# Patient Record
Sex: Female | Born: 1990 | Race: Black or African American | Hispanic: No | Marital: Single | Smoking: Former smoker
Health system: Southern US, Community
[De-identification: ages and names within clinical notes are randomized; demographics above are authoritative.]

## PROBLEM LIST (undated history)

## (undated) DIAGNOSIS — N730 Acute parametritis and pelvic cellulitis: Secondary | ICD-10-CM

## (undated) DIAGNOSIS — O139 Gestational [pregnancy-induced] hypertension without significant proteinuria, unspecified trimester: Secondary | ICD-10-CM

## (undated) DIAGNOSIS — B999 Unspecified infectious disease: Secondary | ICD-10-CM

## (undated) HISTORY — PX: WISDOM TOOTH EXTRACTION: SHX21

## (undated) HISTORY — DX: Gestational (pregnancy-induced) hypertension without significant proteinuria, unspecified trimester: O13.9

## (undated) HISTORY — PX: THERAPEUTIC ABORTION: SHX798

---
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
## 2010-10-18 LAB — URINE CULTURE: Colony Count: 30000

## 2010-10-18 LAB — POCT URINALYSIS DIP (DEVICE)
Hgb urine dipstick: NEGATIVE
Nitrite: NEGATIVE
Urobilinogen, UA: 0.2 mg/dL (ref 0.0–1.0)
pH: 6 (ref 5.0–8.0)

## 2010-10-18 LAB — HIV ANTIBODY (ROUTINE TESTING W REFLEX): HIV: NONREACTIVE

## 2010-10-18 LAB — WET PREP, GENITAL

## 2011-05-03 LAB — POCT URINALYSIS DIP (DEVICE)
Bilirubin Urine: NEGATIVE
Glucose, UA: NEGATIVE
Hgb urine dipstick: NEGATIVE
Nitrite: NEGATIVE
Operator id: 247071

## 2013-04-04 ENCOUNTER — Inpatient Hospital Stay (HOSPITAL_COMMUNITY)
Admission: AD | Admit: 2013-04-04 | Discharge: 2013-04-04 | Disposition: A | Discharge status: Home / Self Care | Payer: Self-pay | Source: Ambulatory Visit | Attending: Obstetrics & Gynecology | Admitting: Obstetrics & Gynecology

## 2013-04-04 ENCOUNTER — Encounter (HOSPITAL_COMMUNITY): Payer: Self-pay | Admitting: *Deleted

## 2013-04-04 DIAGNOSIS — B349 Viral infection, unspecified: Secondary | ICD-10-CM

## 2013-04-04 DIAGNOSIS — R112 Nausea with vomiting, unspecified: Secondary | ICD-10-CM | POA: Insufficient documentation

## 2013-04-04 DIAGNOSIS — A088 Other specified intestinal infections: Secondary | ICD-10-CM | POA: Insufficient documentation

## 2013-04-04 DIAGNOSIS — J040 Acute laryngitis: Secondary | ICD-10-CM | POA: Insufficient documentation

## 2013-04-04 DIAGNOSIS — B9789 Other viral agents as the cause of diseases classified elsewhere: Secondary | ICD-10-CM

## 2013-04-04 LAB — URINALYSIS, ROUTINE W REFLEX MICROSCOPIC
Hgb urine dipstick: NEGATIVE
Leukocytes, UA: NEGATIVE
Nitrite: NEGATIVE
Specific Gravity, Urine: 1.025 (ref 1.005–1.030)
Urobilinogen, UA: 0.2 mg/dL (ref 0.0–1.0)

## 2013-04-04 LAB — POCT PREGNANCY, URINE: Preg Test, Ur: NEGATIVE

## 2013-04-04 MED ORDER — ONDANSETRON 4 MG PO TBDP: 4.0000 mg | ORAL_TABLET | Freq: Once | ORAL | Status: DC

## 2013-04-04 MED ORDER — ONDANSETRON 4 MG PO TBDP
4.0000 mg | ORAL_TABLET | Freq: Once | ORAL | Status: AC
  Administered 2013-04-04: 4 mg via ORAL
  Filled 2013-04-04: qty 1

## 2013-04-04 MED ORDER — IBUPROFEN 600 MG PO TABS: 600.0000 mg | ORAL_TABLET | Freq: Four times a day (QID) | ORAL | Status: DC | PRN

## 2013-04-04 NOTE — MAU Note (Signed)
Patient c/o of nausea and vomiting since yesterday. Patient reports that she can't keep food or water down since yesterday. Patient also reports headache 7/10 pain.

## 2013-04-04 NOTE — MAU Provider Note (Signed)
History     CSN: [id number]  Arrival date and time: 04/04/13 4098   None     Chief Complaint  Patient presents with  . Nausea  . Emesis  . Headache   HPI -Endorses N, V, Diarrhea, HA, fatigue, lightheadedness, hoarseness, and non-productive cough . -Onset yesterday. Gradually worsening. -Vomited >10X in past 24hrs. Unable to keep any food / fluids down. -No alleviating / exacerbating factors -Positive sick contact (friend with similar symptoms. Diagnosed as viral infection) -No recent change in diet. No ingestion of raw, old, new foods.    History reviewed. No pertinent past medical history.  Past Surgical History  Procedure Laterality Date  . Wisdom tooth extraction      Family History  Problem Relation Age of Onset  . Hypertension Mother   . Asthma Father   . Hypertension Father     History  Substance Use Topics  . Smoking status: Current Some Day Smoker -- 2 years    Types: Cigars  . Smokeless tobacco: Not on file  . Alcohol Use: Yes     Comment: Patient reports drinking alcohol every other week, once or twice    Allergies: No Known Allergies  Prescriptions prior to admission  Medication Sig Dispense Refill  . acetaminophen (TYLENOL) 500 MG tablet Take 1,000 mg by mouth every 6 (six) hours as needed for pain.        Review of Systems  Constitutional: Positive for malaise/fatigue and diaphoresis (with vomiting). Negative for fever.  HENT: Negative for neck pain.   Eyes: Positive for photophobia.  Respiratory: Positive for cough (over past 4 days). Negative for hemoptysis and sputum production.        Hoarseness  Gastrointestinal: Positive for vomiting (over 10X in past 24hrs) and diarrhea (1X this AM). Negative for abdominal pain, constipation and blood in stool.  Genitourinary: Negative for hematuria.  Musculoskeletal: Positive for myalgias (mild).  Neurological: Positive for weakness and headaches (pain of 7/10 ).   Physical Exam   Blood pressure  121/83, pulse 89, temperature 97.8 F (36.6 C), temperature source Oral, resp. rate 18, height 4\' 11"  (1.499 m), weight 50.984 kg (112 lb 6.4 oz), SpO2 100.00%.  Physical Exam  Constitutional: She is oriented to person, place, and time. She appears well-developed and well-nourished.  Eyes: No scleral icterus.  Neck: Neck supple.  Cardiovascular: Normal rate and regular rhythm.  Exam reveals no gallop and no friction rub.   No murmur heard. Respiratory: Effort normal and breath sounds normal. No stridor. No respiratory distress. She has no wheezes. She has no rales.  GI: Soft. She exhibits no distension and no mass. There is tenderness (TTP left). There is guarding.  Diminished BS ULQ. Normal BS elsewhere.  Lymphadenopathy:    She has no cervical adenopathy.  Neurological: She is alert and oriented to person, place, and time.  Skin: Skin is warm and dry. She is not diaphoretic.    MAU Course  Procedures  Results for orders placed during the hospital encounter of 04/04/13 (from the past 24 hour(s))  URINALYSIS, ROUTINE W REFLEX MICROSCOPIC     Status: None   Collection Time    04/04/13  9:35 AM      Result Value Range   Color, Urine YELLOW  YELLOW   APPearance CLEAR  CLEAR   Specific Gravity, Urine 1.025  1.005 - 1.030   pH 6.0  5.0 - 8.0   Glucose, UA NEGATIVE  NEGATIVE mg/dL   Hgb urine dipstick NEGATIVE  NEGATIVE   Bilirubin Urine NEGATIVE  NEGATIVE   Ketones, ur NEGATIVE  NEGATIVE mg/dL   Protein, ur NEGATIVE  NEGATIVE mg/dL   Urobilinogen, UA 0.2  0.0 - 1.0 mg/dL   Nitrite NEGATIVE  NEGATIVE   Leukocytes, UA NEGATIVE  NEGATIVE  POCT PREGNANCY, URINE     Status: None   Collection Time    04/04/13  9:54 AM      Result Value Range   Preg Test, Ur NEGATIVE  NEGATIVE   MDM   Assessment and Plan  Viral gastroenteritis, Viral Laryngitis -Symptomatic treatment -Work Excuse -Zofran while here -F/U with PCP  LICHTENSTEIN, JONATHAN 04/04/2013, 10:20 AM   Evaluation and  management procedures were performed by PA-S under my supervision/collaboration. Chart reviewed, patient examined by me and I agree with management and plan.

## 2013-10-08 ENCOUNTER — Inpatient Hospital Stay (HOSPITAL_COMMUNITY)
Admission: AD | Admit: 2013-10-08 | Discharge: 2013-10-08 | Disposition: A | Discharge status: Home / Self Care | Payer: Managed Care, Other (non HMO) | Source: Ambulatory Visit | Attending: Obstetrics and Gynecology | Admitting: Obstetrics and Gynecology

## 2013-10-08 ENCOUNTER — Encounter (HOSPITAL_COMMUNITY): Payer: Self-pay | Admitting: *Deleted

## 2013-10-08 DIAGNOSIS — R109 Unspecified abdominal pain: Secondary | ICD-10-CM | POA: Insufficient documentation

## 2013-10-08 DIAGNOSIS — A088 Other specified intestinal infections: Secondary | ICD-10-CM | POA: Insufficient documentation

## 2013-10-08 DIAGNOSIS — R112 Nausea with vomiting, unspecified: Secondary | ICD-10-CM

## 2013-10-08 DIAGNOSIS — J029 Acute pharyngitis, unspecified: Secondary | ICD-10-CM | POA: Insufficient documentation

## 2013-10-08 DIAGNOSIS — A084 Viral intestinal infection, unspecified: Secondary | ICD-10-CM

## 2013-10-08 DIAGNOSIS — R059 Cough, unspecified: Secondary | ICD-10-CM | POA: Insufficient documentation

## 2013-10-08 DIAGNOSIS — R05 Cough: Secondary | ICD-10-CM | POA: Insufficient documentation

## 2013-10-08 DIAGNOSIS — R51 Headache: Secondary | ICD-10-CM | POA: Insufficient documentation

## 2013-10-08 DIAGNOSIS — F17200 Nicotine dependence, unspecified, uncomplicated: Secondary | ICD-10-CM | POA: Insufficient documentation

## 2013-10-08 HISTORY — DX: Unspecified infectious disease: B99.9

## 2013-10-08 HISTORY — DX: Acute parametritis and pelvic cellulitis: N73.0

## 2013-10-08 LAB — URINALYSIS, ROUTINE W REFLEX MICROSCOPIC
Bilirubin Urine: NEGATIVE
GLUCOSE, UA: NEGATIVE mg/dL
Ketones, ur: NEGATIVE mg/dL
Leukocytes, UA: NEGATIVE
Nitrite: NEGATIVE
PH: 6 (ref 5.0–8.0)
Protein, ur: NEGATIVE mg/dL
Urobilinogen, UA: 0.2 mg/dL (ref 0.0–1.0)

## 2013-10-08 LAB — URINE MICROSCOPIC-ADD ON

## 2013-10-08 LAB — POCT PREGNANCY, URINE: PREG TEST UR: NEGATIVE

## 2013-10-08 MED ORDER — ONDANSETRON 4 MG PO TBDP: 4.0000 mg | ORAL_TABLET | Freq: Four times a day (QID) | ORAL | Status: DC | PRN

## 2013-10-08 MED ORDER — ONDANSETRON 4 MG PO TBDP
4.0000 mg | ORAL_TABLET | Freq: Once | ORAL | Status: AC
  Administered 2013-10-08: 4 mg via ORAL
  Filled 2013-10-08: qty 1

## 2013-10-08 NOTE — Progress Notes (Signed)
Written and verbal d/c instructions given and understanding voiced. Dr Reola CalkinsBeck in earlier to discuss d/c plan

## 2013-10-08 NOTE — Discharge Instructions (Signed)
Viral Gastroenteritis Viral gastroenteritis is also known as stomach flu. This condition affects the stomach and intestinal tract. It can cause sudden diarrhea and vomiting. The illness typically lasts 3 to 8 days. Most people develop an immune response that eventually gets rid of the virus. While this natural response develops, the virus can make you quite ill. CAUSES  Many different viruses can cause gastroenteritis, such as rotavirus or noroviruses. You can catch one of these viruses by consuming contaminated food or water. You may also catch a virus by sharing utensils or other personal items with an infected person or by touching a contaminated surface. SYMPTOMS  The most common symptoms are diarrhea and vomiting. These problems can cause a severe loss of body fluids (dehydration) and a body salt (electrolyte) imbalance. Other symptoms may include:  Fever.  Headache.  Fatigue.  Abdominal pain. DIAGNOSIS  Your caregiver can usually diagnose viral gastroenteritis based on your symptoms and a physical exam. A stool sample may also be taken to test for the presence of viruses or other infections. TREATMENT  This illness typically goes away on its own. Treatments are aimed at rehydration. The most serious cases of viral gastroenteritis involve vomiting so severely that you are not able to keep fluids down. In these cases, fluids must be given through an intravenous line (IV). HOME CARE INSTRUCTIONS   Drink enough fluids to keep your urine clear or pale yellow. Drink small amounts of fluids frequently and increase the amounts as tolerated.  Ask your caregiver for specific rehydration instructions.  Avoid:  Foods high in sugar.  Alcohol.  Carbonated drinks.  Tobacco.  Juice.  Caffeine drinks.  Extremely hot or cold fluids.  Fatty, greasy foods.  Too much intake of anything at one time.  Dairy products until 24 to 48 hours after diarrhea stops.  You may consume probiotics.  Probiotics are active cultures of beneficial bacteria. They may lessen the amount and number of diarrheal stools in adults. Probiotics can be found in yogurt with active cultures and in supplements.  Wash your hands well to avoid spreading the virus.  Only take over-the-counter or prescription medicines for pain, discomfort, or fever as directed by your caregiver. Do not give aspirin to children. Antidiarrheal medicines are not recommended.  Ask your caregiver if you should continue to take your regular prescribed and over-the-counter medicines.  Keep all follow-up appointments as directed by your caregiver. SEEK IMMEDIATE MEDICAL CARE IF:   You are unable to keep fluids down.  You do not urinate at least once every 6 to 8 hours.  You develop shortness of breath.  You notice blood in your stool or vomit. This may look like coffee grounds.  You have abdominal pain that increases or is concentrated in one small area (localized).  You have persistent vomiting or diarrhea.  You have a fever.  The patient is a child younger than 3 months, and he or she has a fever.  The patient is a child older than 3 months, and he or she has a fever and persistent symptoms.  The patient is a child older than 3 months, and he or she has a fever and symptoms suddenly get worse.  The patient is a baby, and he or she has no tears when crying. MAKE SURE YOU:   Understand these instructions.  Will watch your condition.  Will get help right away if you are not doing well or get worse. Document Released: 07/18/2005 Document Revised: 10/10/2011 Document Reviewed: 05/04/2011   Exit[employer] Patient Information 2014 [employer].  

## 2013-10-08 NOTE — MAU Note (Signed)
Patient states she has had a cold with cough and sore throat for 3 days. Started having a headache and abdominal pain yesterday and started vomiting multiple times and having diarrhea today.

## 2013-10-08 NOTE — MAU Provider Note (Signed)
History     CSN: [id number]  Arrival date and time: 10/08/13 1151   First Provider Initiated Contact with Patient 10/08/13 1234      Chief Complaint  Patient presents with  . Headache  . Abdominal Pain  . Emesis  . Sore Throat  . Cough  . Diarrhea   HPI  Sarah Collier is a 22yo G65P0020 female who presents to MAU today with HA x 2days, vomiting, diarrhea, cough, hoarseness, and abdominal pain x 1 day. She reports that she has vomited 5 times since this morning, but denies blood. She has also had diarrhea without blood multiple times. Her abdominal pain is intermittent, sharp, and in the RLQ and LLQ and related to her bowel movements. Her cough and hoarseness began this morning. She took Tylenol this morning without relief.   No fevers, chills, chest pain, sob.  States has been around other sick contacts as well.   Past Medical History  Diagnosis Date  . PID (acute pelvic inflammatory disease)   . Infection     gonorrhea, chlamydia    Past Surgical History  Procedure Laterality Date  . Wisdom tooth extraction    . Therapeutic abortion      Family History  Problem Relation Age of Onset  . Hypertension Mother   . Asthma Father   . Hypertension Father   . Diabetes Maternal Grandmother   . Hearing loss Neg Hx     History  Substance Use Topics  . Smoking status: Current Some Day Smoker -- 2 years    Types: Cigars  . Smokeless tobacco: Never Used  . Alcohol Use: Yes     Comment: Patient reports drinking alcohol every other week, once or twice    Allergies: No Known Allergies  No prescriptions prior to admission    Review of Systems  Constitutional: Negative for fever, chills and weight loss.  Respiratory: Positive for cough. Negative for hemoptysis.   Gastrointestinal: Positive for nausea, vomiting, abdominal pain and diarrhea. Negative for blood in stool.   Physical Exam   Blood pressure 131/89, pulse 103, temperature 98.7 F (37.1 C), temperature source  Oral, resp. rate 16, height 4' 10.5" (1.486 m), weight 58.786 kg (129 lb 9.6 oz), last menstrual period 09/26/2013, SpO2 98.00%.  Physical Exam  Constitutional: She is oriented to person, place, and time. She appears well-developed and well-nourished. No distress.  HENT:  Mouth/Throat: Oropharynx is clear and moist.  Eyes: EOM are normal. Pupils are equal, round, and reactive to light.  Neck: Neck supple.  Cardiovascular: Normal rate, regular rhythm and normal heart sounds.   Respiratory: Effort normal and breath sounds normal.  GI: Soft. Bowel sounds are normal. She exhibits no distension and no mass. There is tenderness (mild and diffusely throughout abdomen worse with abdominal crunches). There is no rebound and no guarding.  Musculoskeletal: Normal range of motion.  Neurological: She is alert and oriented to person, place, and time.  Skin: Skin is warm and dry. No rash noted. She is not diaphoretic.  Psychiatric: She has a normal mood and affect.    MAU Course  Procedures  MDM odt zofran Po challenge   Assessment and Plan  Viral Gastroenteritis   - phenergan in MAU   - f/u with PCP if condition does not resolve  Manya Silvas 10/08/2013, 12:44 PM   I have seen and examined this patient and agree with above documentation in the PA student's note.   Pt is a 23 yo G2P0020 who  presented to MAU for 1 day of URI symptoms and this AM acute onset of vomiting and diarrhea.  Last episode several hours ago. On exam, her abdomen is soft, ND with normal bowel sounds and the mild amount of tenderness that was there on exam is worsened by abdominal crunches suggesting a msk etiology from vomiting.  zofran given in MAU with resolution of nausea and she was able to take po. Likely viral syndrome. Doubt appendicitis, ovarian torsion or cyst due to benign exam and time course.  D/c to home with zofran and instructions for OTC medication for URI symptoms.  F/u prn.     Rulon AbideKeli Ac Colan, M.D. [facility]  Fellow 10/08/2013 5:02 PM

## 2013-10-08 NOTE — MAU Note (Signed)
Cough started yesterday- non-productive, no voice when woke up.vomiting and diarrhea started today- that is what woke her  Has not taken anything

## 2013-10-09 NOTE — MAU Provider Note (Signed)
Attestation of Attending Supervision of Advanced Practitioner (CNM/NP): Evaluation and management procedures were performed by the Advanced Practitioner under my supervision and collaboration.  I have reviewed the Advanced Practitioner's note and chart, and I agree with the management and plan.  Dennys Guin 10/09/2013 12:14 PM

## 2014-05-27 ENCOUNTER — Ambulatory Visit
Admission: RE | Admit: 2014-05-27 | Discharge: 2014-05-27 | Disposition: A | Discharge status: Home / Self Care | Payer: No Typology Code available for payment source | Source: Ambulatory Visit | Attending: Infectious Disease | Admitting: Infectious Disease

## 2014-05-27 ENCOUNTER — Other Ambulatory Visit: Payer: Self-pay | Admitting: Infectious Disease

## 2014-05-27 DIAGNOSIS — R7611 Nonspecific reaction to tuberculin skin test without active tuberculosis: Secondary | ICD-10-CM

## 2014-06-02 ENCOUNTER — Encounter (HOSPITAL_COMMUNITY): Payer: Self-pay | Admitting: *Deleted

## 2016-07-27 ENCOUNTER — Encounter: Payer: Self-pay | Admitting: *Deleted

## 2016-07-27 ENCOUNTER — Ambulatory Visit (INDEPENDENT_AMBULATORY_CARE_PROVIDER_SITE_OTHER): Payer: Self-pay | Admitting: *Deleted

## 2016-07-27 ENCOUNTER — Encounter (HOSPITAL_COMMUNITY): Payer: Self-pay | Admitting: *Deleted

## 2016-07-27 ENCOUNTER — Inpatient Hospital Stay (HOSPITAL_COMMUNITY)
Admission: AD | Admit: 2016-07-27 | Discharge: 2016-07-27 | Disposition: A | Discharge status: Home / Self Care | Payer: Self-pay | Source: Ambulatory Visit | Attending: Family Medicine | Admitting: Family Medicine

## 2016-07-27 DIAGNOSIS — Z3A00 Weeks of gestation of pregnancy not specified: Secondary | ICD-10-CM | POA: Insufficient documentation

## 2016-07-27 DIAGNOSIS — Z3200 Encounter for pregnancy test, result unknown: Secondary | ICD-10-CM

## 2016-07-27 DIAGNOSIS — R110 Nausea: Secondary | ICD-10-CM | POA: Insufficient documentation

## 2016-07-27 DIAGNOSIS — Z3201 Encounter for pregnancy test, result positive: Secondary | ICD-10-CM

## 2016-07-27 DIAGNOSIS — O26891 Other specified pregnancy related conditions, first trimester: Secondary | ICD-10-CM | POA: Insufficient documentation

## 2016-07-27 DIAGNOSIS — O26899 Other specified pregnancy related conditions, unspecified trimester: Secondary | ICD-10-CM

## 2016-07-27 DIAGNOSIS — R030 Elevated blood-pressure reading, without diagnosis of hypertension: Secondary | ICD-10-CM | POA: Insufficient documentation

## 2016-07-27 LAB — POCT PREGNANCY, URINE: Preg Test, Ur: POSITIVE — AB

## 2016-07-27 NOTE — Discharge Instructions (Signed)
Nausea, Adult Nausea is the feeling of an upset stomach or having to vomit. Nausea on its own is not usually a serious concern, but it may be an early sign of a more serious medical problem. As nausea gets worse, it can lead to vomiting. If vomiting develops, or if you are not able to drink enough fluids, you are at risk of becoming dehydrated. Dehydration can make you tired and thirsty, cause you to have a dry mouth, and decrease how often you urinate. Older adults and people with other diseases or a weak immune system are at higher risk for dehydration. The main goals of treating your nausea are:  To limit repeated nausea episodes.  To prevent vomiting and dehydration. Follow these instructions at home: Follow instructions from your health care provider about how to care for yourself at home. Eating and drinking Follow these recommendations as told by your health care provider:  Take an oral rehydration solution (ORS). This is a drink that is sold at pharmacies and retail stores.  Drink clear fluids in small amounts as you are able. Clear fluids include water, ice chips, diluted fruit juice, and low-calorie sports drinks.  Eat bland, easy-to-digest foods in small amounts as you are able. These foods include bananas, applesauce, rice, lean meats, toast, and crackers.  Avoid drinking fluids that contain a lot of sugar or caffeine, such as energy drinks, sports drinks, and soda.  Avoid alcohol.  Avoid spicy or fatty foods. General instructions  Drink enough fluid to keep your urine clear or pale yellow.  Wash your hands often. If soap and water are not available, use hand sanitizer.  Make sure that all people in your household wash their hands well and often.  Rest at home while you recover.  Take over-the-counter and prescription medicines only as told by your health care provider.  Breathe slowly and deeply when you feel nauseous.  Watch your condition for any changes.  Keep  all follow-up visits as told by your health care provider. This is important. Contact a health care provider if:  You have a headache.  You have new symptoms.  Your nausea gets worse.  You have a fever.  You feel light-headed or dizzy.  You vomit.  You cannot keep fluids down. Get help right away if:  You have pain in your chest, neck, arm, or jaw.  You feel extremely weak or you faint.  You have vomit that is bright red or looks like coffee grounds.  You have bloody or black stools or stools that look like tar.  You have a severe headache, a stiff neck, or both.  You have severe pain, cramping, or bloating in your abdomen.  You have a rash.  You have difficulty breathing or are breathing very quickly.  Your heart is beating very quickly.  Your skin feels cold and clammy.  You feel confused.  You have pain when you urinate.  You have signs of dehydration, such as:  Dark urine, very little, or no urine.  Cracked lips.  Dry mouth.  Sunken eyes.  Sleepiness.  Weakness. These symptoms may represent a serious problem that is an emergency. Do not wait to see if the symptoms will go away. Get medical help right away. Call your local emergency services (911 in the [place] Do not drive yourself to the hospital.  This information is not intended to replace advice given to you by your health care provider. Make sure you discuss any questions you have with  your health care provider. Document Released: 08/25/2004 Document Revised: 12/21/2015 Document Reviewed: 03/24/2015

## 2016-07-27 NOTE — MAU Note (Signed)
Missed her period this month.  2 -+HPT last wk.  Been so sick and nauseous in the morning, did not vomit today. No pain, no bleeding.

## 2016-07-27 NOTE — MAU Provider Note (Signed)
Sarah Collier is a 25 y.o. G2P0020 with LMP of 06/16/16 presents to the MAU stating she has had 2 positive HPT and no period this month. Patient reports no birth control but not a planned pregnancy. She is planning to keep the pregnancy. She complains of nausea that is worse when she get up in the morning. She denies any other problems.   BP 140/91 (BP Location: Right Arm)   Pulse 79   Temp 97.7 F (36.5 C) (Oral)   Resp 16   Ht 5' (1.524 m)   Wt 136 lb (61.7 kg)   LMP 06/18/2016   BMI 26.56 kg/m    Patient is alert and in NAD. She appears comfortable and well hydrated. She has not abdominal tenderness on plapation. Instructions to patient verbally and in writing regarding medication for nausea, prenatal vitamins and other medications during pregnancy.  List of OB providers given to the patient. She will make an appointment to start prenatal care. She will return here for any problems.   Discussed with patient slightly elevated BP and she reports she is anxious but will continue to have it rechecked and will start prenatal care. Patient will go to [facility] for pregnancy verification and will return here as needed for any problems.  Medical screening exam complete and patient stable [facility] Return precautions given.

## 2016-07-27 NOTE — Progress Notes (Signed)
Sarah Collier had a positive pregnancy test in our office. She plans to go [facility]. Given a letter of proof of pregnancy. Reviewed meds with her- not taking any but states going to get zofran filled that was prescribed by MAU.

## 2016-09-14 ENCOUNTER — Encounter: Payer: Self-pay | Admitting: Certified Nurse Midwife

## 2016-09-14 ENCOUNTER — Other Ambulatory Visit (HOSPITAL_COMMUNITY)
Admission: RE | Admit: 2016-09-14 | Discharge: 2016-09-14 | Disposition: A | Discharge status: Home / Self Care | Payer: Medicaid Other | Source: Ambulatory Visit | Attending: Certified Nurse Midwife | Admitting: Certified Nurse Midwife

## 2016-09-14 ENCOUNTER — Ambulatory Visit (INDEPENDENT_AMBULATORY_CARE_PROVIDER_SITE_OTHER): Payer: Medicaid Other | Admitting: Certified Nurse Midwife

## 2016-09-14 VITALS — BP 138/85 | HR 78 | Wt 144.0 lb

## 2016-09-14 DIAGNOSIS — O219 Vomiting of pregnancy, unspecified: Secondary | ICD-10-CM

## 2016-09-14 DIAGNOSIS — Z113 Encounter for screening for infections with a predominantly sexual mode of transmission: Secondary | ICD-10-CM | POA: Diagnosis present

## 2016-09-14 DIAGNOSIS — Z01419 Encounter for gynecological examination (general) (routine) without abnormal findings: Secondary | ICD-10-CM | POA: Insufficient documentation

## 2016-09-14 DIAGNOSIS — Z3480 Encounter for supervision of other normal pregnancy, unspecified trimester: Secondary | ICD-10-CM

## 2016-09-14 DIAGNOSIS — Z3481 Encounter for supervision of other normal pregnancy, first trimester: Secondary | ICD-10-CM | POA: Diagnosis not present

## 2016-09-14 DIAGNOSIS — O0990 Supervision of high risk pregnancy, unspecified, unspecified trimester: Secondary | ICD-10-CM | POA: Insufficient documentation

## 2016-09-14 LAB — POCT URINALYSIS DIPSTICK
Bilirubin, UA: NEGATIVE
Glucose, UA: NEGATIVE
Ketones, UA: NEGATIVE
NITRITE UA: POSITIVE
RBC UA: NEGATIVE
SPEC GRAV UA: 1.02
UROBILINOGEN UA: NEGATIVE
pH, UA: 6.5

## 2016-09-14 MED ORDER — TERCONAZOLE 0.8 % VA CREA: 1.0000 | TOPICAL_CREAM | Freq: Every day | VAGINAL | 0 refills | Status: DC

## 2016-09-14 MED ORDER — PRENATE PIXIE 10-0.6-0.4-200 MG PO CAPS: 1.0000 | ORAL_CAPSULE | Freq: Every day | ORAL | 12 refills | Status: DC

## 2016-09-14 MED ORDER — NITROFURANTOIN MONOHYD MACRO 100 MG PO CAPS: 100.0000 mg | ORAL_CAPSULE | Freq: Two times a day (BID) | ORAL | 0 refills | Status: DC

## 2016-09-14 MED ORDER — DOXYLAMINE-PYRIDOXINE 10-10 MG PO TBEC: DELAYED_RELEASE_TABLET | ORAL | 4 refills | Status: DC

## 2016-09-14 MED ORDER — ONDANSETRON HCL 8 MG PO TABS: 8.0000 mg | ORAL_TABLET | Freq: Three times a day (TID) | ORAL | 2 refills | Status: DC | PRN

## 2016-09-14 NOTE — Progress Notes (Signed)
Subjective:    Sarah Collier is being seen today for her first obstetrical visit.  This is a planned pregnancy. She is at [redacted]w[redacted]d gestation. Her obstetrical history is significant for PID in the past. Relationship with FOB: significant other, not living together. Patient does intend to breast feed. Pregnancy history fully reviewed.  The information documented in the HPI was reviewed and verified.  Menstrual History: OB History    Gravida Para Term Preterm AB Living   3       2     SAB TAB Ectopic Multiple Live Births     2             Patient's last menstrual period was 06/18/2016.    Past Medical History:  Diagnosis Date  . Infection    gonorrhea, chlamydia  . PID (acute pelvic inflammatory disease)     Past Surgical History:  Procedure Laterality Date  . THERAPEUTIC ABORTION    . WISDOM TOOTH EXTRACTION       (Not in a hospital admission) No Known Allergies  Social History  Substance Use Topics  . Smoking status: Former Smoker    Years: 2.00    Types: Cigars  . Smokeless tobacco: Never Used  . Alcohol use No     Comment: Patient reports drinking alcohol every other week, once or twice    Family History  Problem Relation Age of Onset  . Hypertension Mother   . Asthma Father   . Hypertension Father   . Diabetes Maternal Grandmother   . Hearing loss Neg Hx      Review of Systems Constitutional: negative for weight loss Gastrointestinal: + for N&V Genitourinary:negative for genital lesions and vaginal discharge and dysuria Musculoskeletal:negative for back pain Behavioral/Psych: negative for abusive relationship, depression, illegal drug usage and tobacco use    Objective:    BP 138/85   Pulse 78   Wt 144 lb (65.3 kg)   LMP 06/18/2016   BMI 28.12 kg/m  General Appearance:    Alert, cooperative, no distress, appears stated age  Head:    Normocephalic, without obvious abnormality, atraumatic  Eyes:    PERRL, conjunctiva/corneas clear, EOM's intact,  fundi    benign, both eyes  Ears:    Normal TM's and external ear canals, both ears  Nose:   Nares normal, septum midline, mucosa normal, no drainage    or sinus tenderness  Throat:   Lips, mucosa, and tongue normal; teeth and gums normal  Neck:   Supple, symmetrical, trachea midline, no adenopathy;    thyroid:  no enlargement/tenderness/nodules; no carotid   bruit or JVD  Back:     Symmetric, no curvature, ROM normal, no CVA tenderness  Lungs:     Clear to auscultation bilaterally, respirations unlabored  Chest Wall:    No tenderness or deformity   Heart:    Regular rate and rhythm, S1 and S2 normal, no murmur, rub   or gallop  Breast Exam:    No tenderness, masses, or nipple abnormality  Abdomen:     Soft, non-tender, bowel sounds active all four quadrants,    no masses, no organomegaly  Genitalia:    Normal female without lesion, discharge or tenderness  Extremities:   Extremities normal, atraumatic, no cyanosis or edema  Pulses:   2+ and symmetric all extremities  Skin:   Skin color, texture, turgor normal, no rashes or lesions  Lymph nodes:   Cervical, supraclavicular, and axillary nodes normal  Neurologic:  CNII-XII intact, normal strength, sensation and reflexes    throughout      Cervix:  Long, thick, closed and anterior. FHR: 147 by doppler.  FH:c/w dates.     Lab Review Urine pregnancy test Labs reviewed yes Radiologic studies reviewed no Assessment:    Pregnancy at [redacted]w[redacted]d weeks      ICD-9-CM ICD-10-CM   1. Supervision of other normal pregnancy, antepartum V22.1 Z34.80 Hemoglobinopathy evaluation     Varicella zoster antibody, IgG     VITAMIN D 25 Hydroxy (Vit-D Deficiency, Fractures)     Culture, OB Urine     MaterniT21 PLUS Core+SCA     Cystic Fibrosis Mutation 97     ToxASSURE Select 13 (MW), Urine     Obstetric Panel, Including HIV     Cytology - PAP     Cervicovaginal ancillary only     Hemoglobin A1c     Prenat-FeAsp-Meth-FA-DHA w/o A (PRENATE PIXIE)  10-0.6-0.4-200 MG CAPS     Doxylamine-Pyridoxine (DICLEGIS) 10-10 MG TBEC     POCT urinalysis dipstick     terconazole (TERAZOL 3) 0.8 % vaginal cream     nitrofurantoin, macrocrystal-monohydrate, (MACROBID) 100 MG capsule  2. Nausea and vomiting during pregnancy prior to [redacted] weeks gestation 643.00 O21.9 Doxylamine-Pyridoxine (DICLEGIS) 10-10 MG TBEC     ondansetron (ZOFRAN) 8 MG tablet    Plan:      Prenatal vitamins.  Counseling provided regarding continued use of seat belts, cessation of alcohol consumption, smoking or use of illicit drugs; infection precautions i.e., influenza/TDAP immunizations, toxoplasmosis,CMV, parvovirus, listeria and varicella; workplace safety, exercise during pregnancy; routine dental care, safe medications, sexual activity, hot tubs, saunas, pools, travel, caffeine use, fish and methlymercury, potential toxins, hair treatments, varicose veins Weight gain recommendations per IOM guidelines reviewed: underweight/BMI< 18.5--> gain 28 - 40 lbs; normal weight/BMI 18.5 - 24.9--> gain 25 - 35 lbs; overweight/BMI 25 - 29.9--> gain 15 - 25 lbs; obese/BMI >30->gain  11 - 20 lbs Problem list reviewed and updated. FIRST/CF mutation testing/NIPT/QUAD SCREEN/fragile X/[name] population testing/Spinal muscular atrophy discussed: ordered. Role of ultrasound in pregnancy discussed; fetal survey: requested. Amniocentesis discussed: not indicated.  Meds ordered this encounter  Medications  . prenatal vitamin w/FE, FA (NATACHEW) 29-1 MG CHEW chewable tablet    Sig: Chew 1 tablet by mouth daily at 12 noon.  . Prenat-FeAsp-Meth-FA-DHA w/o A (PRENATE PIXIE) 10-0.6-0.4-200 MG CAPS    Sig: Take 1 tablet by mouth daily.    Dispense:  30 capsule    Refill:  12    Please process coupon: Rx BIN: [id number], RxPCN: [id number], RxGRP: [id number], RxID: [id number]  SUF: 01  . Doxylamine-Pyridoxine (DICLEGIS) 10-10 MG TBEC    Sig: Take 1 tablet with breakfast and lunch.  Take 2 tablets at  bedtime.    Dispense:  100 tablet    Refill:  4  . ondansetron (ZOFRAN) 8 MG tablet    Sig: Take 1 tablet (8 mg total) by mouth every 8 (eight) hours as needed for nausea or vomiting.    Dispense:  40 tablet    Refill:  2  . terconazole (TERAZOL 3) 0.8 % vaginal cream    Sig: Place 1 applicator vaginally at bedtime.    Dispense:  20 g    Refill:  0  . nitrofurantoin, macrocrystal-monohydrate, (MACROBID) 100 MG capsule    Sig: Take 1 capsule (100 mg total) by mouth 2 (two) times daily. 1 po BID x 7days    Dispense:  14 capsule  Refill:  0   Orders Placed This Encounter  Procedures  . Culture, OB Urine  . Hemoglobinopathy evaluation  . Varicella zoster antibody, IgG  . VITAMIN D 25 Hydroxy (Vit-D Deficiency, Fractures)  . MaterniT21 PLUS Core+SCA    Order Specific Question:   Is the patient insulin dependent?    Answer:   No    Order Specific Question:   Please enter gestational age. This should be expressed as weeks AND days, i.e. 16w 6d. Enter weeks here. Enter days in next question.    Answer:   5312    Order Specific Question:   Please enter gestational age. This should be expressed as weeks AND days, i.e. 16w 6d. Enter days here. Enter weeks in previous question.    Answer:   4    Order Specific Question:   How was gestational age calculated?    Answer:   LMP    Order Specific Question:   Please give the date of LMP OR Ultrasound OR Estimated date of delivery.    Answer:   03/25/2017    Order Specific Question:   Number of Fetuses (Type of Pregnancy):    Answer:   1    Order Specific Question:   Indications for performing the test? (please choose all that apply):    Answer:   Routine screening    Order Specific Question:   Other Indications? (Y=Yes, N=No)    Answer:   N    Order Specific Question:   If this is a repeat specimen, please indicate the reason:    Answer:   Not indicated    Order Specific Question:   Please specify the patient's race: (C=White/Caucasion,  B=Black, I=Native American, A=Asian, H=Hispanic, O=Other, U=Unknown)    Answer:   B    Order Specific Question:   Donor Egg - indicate if the egg was obtained from in vitro fertilization.    Answer:   N    Order Specific Question:   Age of Egg Donor.    Answer:   [age]    Order Specific Question:   Prior Down Syndrome/ONTD screening during current pregnancy.    Answer:   N    Order Specific Question:   Prior First Trimester Testing    Answer:   N    Order Specific Question:   Prior Second Trimester Testing    Answer:   N    Order Specific Question:   Family History of Neural Tube Defects    Answer:   N    Order Specific Question:   Prior Pregnancy with Down Syndrome    Answer:   N    Order Specific Question:   Please give the patient's weight (in pounds)    Answer:   144  . Cystic Fibrosis Mutation 97  . ToxASSURE Select 13 (MW), Urine  . Obstetric Panel, Including HIV  . Hemoglobin A1c  . POCT urinalysis dipstick    Follow up in 4 weeks. 50% of 45 min visit spent on counseling and coordination of care.

## 2016-09-16 LAB — CERVICOVAGINAL ANCILLARY ONLY
Bacterial vaginitis: NEGATIVE
CHLAMYDIA, DNA PROBE: NEGATIVE
Candida vaginitis: POSITIVE — AB
NEISSERIA GONORRHEA: NEGATIVE
TRICH (WINDOWPATH): POSITIVE — AB

## 2016-09-16 LAB — CYTOLOGY - PAP: Diagnosis: NEGATIVE

## 2016-09-17 LAB — URINE CULTURE, OB REFLEX

## 2016-09-17 LAB — CULTURE, OB URINE

## 2016-09-20 ENCOUNTER — Other Ambulatory Visit: Payer: Self-pay | Admitting: Certified Nurse Midwife

## 2016-09-20 DIAGNOSIS — O23592 Infection of other part of genital tract in pregnancy, second trimester: Secondary | ICD-10-CM

## 2016-09-20 DIAGNOSIS — R7989 Other specified abnormal findings of blood chemistry: Secondary | ICD-10-CM | POA: Insufficient documentation

## 2016-09-20 DIAGNOSIS — A5901 Trichomonal vulvovaginitis: Secondary | ICD-10-CM | POA: Insufficient documentation

## 2016-09-20 DIAGNOSIS — Z3480 Encounter for supervision of other normal pregnancy, unspecified trimester: Secondary | ICD-10-CM

## 2016-09-20 DIAGNOSIS — O2342 Unspecified infection of urinary tract in pregnancy, second trimester: Secondary | ICD-10-CM | POA: Insufficient documentation

## 2016-09-20 LAB — OBSTETRIC PANEL, INCLUDING HIV
Antibody Screen: NEGATIVE
Basophils Absolute: 0 10*3/uL (ref 0.0–0.2)
Basos: 0 %
EOS (ABSOLUTE): 0.1 10*3/uL (ref 0.0–0.4)
EOS: 2 %
HIV Screen 4th Generation wRfx: NONREACTIVE
Hematocrit: 38.1 % (ref 34.0–46.6)
Hemoglobin: 12.4 g/dL (ref 11.1–15.9)
Hepatitis B Surface Ag: NEGATIVE
IMMATURE GRANS (ABS): 0 10*3/uL (ref 0.0–0.1)
IMMATURE GRANULOCYTES: 0 %
LYMPHS: 38 %
Lymphocytes Absolute: 3 10*3/uL (ref 0.7–3.1)
MCH: 29.5 pg (ref 26.6–33.0)
MCHC: 32.5 g/dL (ref 31.5–35.7)
MCV: 91 fL (ref 79–97)
MONOS ABS: 0.7 10*3/uL (ref 0.1–0.9)
Monocytes: 9 %
NEUTROS PCT: 51 %
Neutrophils Absolute: 4 10*3/uL (ref 1.4–7.0)
Platelets: 363 10*3/uL (ref 150–379)
RBC: 4.21 x10E6/uL (ref 3.77–5.28)
RDW: 12.5 % (ref 12.3–15.4)
RH TYPE: POSITIVE
RPR Ser Ql: NONREACTIVE
Rubella Antibodies, IGG: 3.59 index (ref >0.99)
WBC: 7.9 10*3/uL (ref 3.4–10.8)

## 2016-09-20 LAB — VITAMIN D 25 HYDROXY (VIT D DEFICIENCY, FRACTURES): VIT D 25 HYDROXY: 22.8 ng/mL — AB (ref 30.0–100.0)

## 2016-09-20 LAB — CYSTIC FIBROSIS MUTATION 97: GENE DIS ANAL CARRIER INTERP BLD/T-IMP: NOT DETECTED

## 2016-09-20 LAB — HEMOGLOBIN A1C
ESTIMATED AVERAGE GLUCOSE: 88 mg/dL
HEMOGLOBIN A1C: 4.7 % — AB (ref 4.8–5.6)

## 2016-09-20 LAB — HEMOGLOBINOPATHY EVALUATION
HEMOGLOBIN F QUANTITATION: 0 % (ref 0.0–2.0)
HGB A: 97.1 % (ref 96.4–98.8)
HGB C: 0 %
HGB S: 0 %
HGB VARIANT: 0 %
Hemoglobin A2 Quantitation: 2.9 % (ref 1.8–3.2)

## 2016-09-20 LAB — VARICELLA ZOSTER ANTIBODY, IGG: Varicella zoster IgG: 1066 index (ref >165)

## 2016-09-20 MED ORDER — VITAMIN D (ERGOCALCIFEROL) 1.25 MG (50000 UNIT) PO CAPS: 50000.0000 [IU] | ORAL_CAPSULE | ORAL | 2 refills | Status: DC

## 2016-09-20 MED ORDER — METRONIDAZOLE 500 MG PO TABS: 2000.0000 mg | ORAL_TABLET | Freq: Once | ORAL | 0 refills | Status: AC

## 2016-09-21 LAB — MATERNIT21 PLUS CORE+SCA
Chromosome 13: NEGATIVE
Chromosome 18: NEGATIVE
Chromosome 21: NEGATIVE
Y CHROMOSOME: DETECTED

## 2016-09-22 ENCOUNTER — Other Ambulatory Visit: Payer: Self-pay | Admitting: Certified Nurse Midwife

## 2016-09-22 DIAGNOSIS — Z3480 Encounter for supervision of other normal pregnancy, unspecified trimester: Secondary | ICD-10-CM

## 2016-09-24 LAB — TOXASSURE SELECT 13 (MW), URINE

## 2016-09-26 ENCOUNTER — Other Ambulatory Visit: Payer: Self-pay | Admitting: Certified Nurse Midwife

## 2016-09-26 DIAGNOSIS — F1210 Cannabis abuse, uncomplicated: Secondary | ICD-10-CM

## 2016-10-12 ENCOUNTER — Ambulatory Visit (INDEPENDENT_AMBULATORY_CARE_PROVIDER_SITE_OTHER): Payer: Medicaid Other | Admitting: Certified Nurse Midwife

## 2016-10-12 ENCOUNTER — Encounter: Payer: Self-pay | Admitting: Certified Nurse Midwife

## 2016-10-12 ENCOUNTER — Other Ambulatory Visit (HOSPITAL_COMMUNITY)
Admission: RE | Admit: 2016-10-12 | Discharge: 2016-10-12 | Disposition: A | Discharge status: Home / Self Care | Payer: Medicaid Other | Source: Ambulatory Visit | Attending: Certified Nurse Midwife | Admitting: Certified Nurse Midwife

## 2016-10-12 VITALS — BP 127/85 | Wt 149.0 lb

## 2016-10-12 DIAGNOSIS — Z3480 Encounter for supervision of other normal pregnancy, unspecified trimester: Secondary | ICD-10-CM

## 2016-10-12 DIAGNOSIS — A5901 Trichomonal vulvovaginitis: Secondary | ICD-10-CM

## 2016-10-12 DIAGNOSIS — O23592 Infection of other part of genital tract in pregnancy, second trimester: Secondary | ICD-10-CM

## 2016-10-12 DIAGNOSIS — Z113 Encounter for screening for infections with a predominantly sexual mode of transmission: Secondary | ICD-10-CM | POA: Insufficient documentation

## 2016-10-12 DIAGNOSIS — R7989 Other specified abnormal findings of blood chemistry: Secondary | ICD-10-CM

## 2016-10-12 DIAGNOSIS — E559 Vitamin D deficiency, unspecified: Secondary | ICD-10-CM

## 2016-10-12 DIAGNOSIS — O99322 Drug use complicating pregnancy, second trimester: Secondary | ICD-10-CM

## 2016-10-12 DIAGNOSIS — F1210 Cannabis abuse, uncomplicated: Secondary | ICD-10-CM

## 2016-10-12 NOTE — Progress Notes (Signed)
Pt states increase in pelvic pressure. 

## 2016-10-12 NOTE — Progress Notes (Signed)
   PRENATAL VISIT NOTE  Subjective:  Sarah Collier is a 26 y.o. G3P0020 at 2424w4d being seen today for ongoing prenatal care.  She is currently monitored for the following is sues for this low-risk pregnancy and has Supervision of other normal pregnancy, antepartum; Low vitamin D level; UTI (urinary tract infection) during pregnancy, second trimester; Trichomonal vaginitis during pregnancy in second trimester; and Mild tetrahydrocannabinol (THC) abuse on her problem list.  Patient reports no bleeding, no contractions, no cramping, no leaking and pelvic pressure, TOC today, told to increase fluid intake especially water: drinking about a bottle a day.  Contractions: Not present. Vag. Bleeding: None.   . Denies leaking of fluid.   The following portions of the patient's history were reviewed and updated as appropriate: allergies, current medications, past family history, past medical history, past social history, past surgical history and problem list. Problem list updated.  Objective:   Vitals:   10/12/16 1021  BP: 127/85  Weight: 149 lb (67.6 kg)    Fetal Status: Fetal Heart Rate (bpm): 142 Fundal Height: 16 cm       General:  Alert, oriented and cooperative. Patient is in no acute distress.  Skin: Skin is warm and dry. No rash noted.   Cardiovascular: Normal heart rate noted  Respiratory: Normal respiratory effort, no problems with respiration noted  Abdomen: Soft, gravid, appropriate for gestational age. Pain/Pressure: Present     Pelvic:  Cervical exam deferred        Extremities: Normal range of motion.     Mental Status: Normal mood and affect. Normal behavior. Normal judgment and thought content.   Assessment and Plan:  Pregnancy: G3P0020 at 6424w4d  1. Trichomonal vaginitis during pregnancy in second trimester      TOC today - Cervicovaginal ancillary only  2. Mild tetrahydrocannabinol (THC) abuse      3. Low vitamin D level     Taking weekly vitamin D.   4.  Supervision of other normal pregnancy, antepartum      Hx of UTI last ob visit, TOC today - Culture, OB Urine - US MFM OB COMP + 14 WK; Future - AFP, Serum, Open Spina Bifida  Preterm labor symptoms and general obstetric precautions including but not limited to vaginal bleeding, contractions, leaking of fluid and fetal movement were reviewed in detail with the patient. Please refer to After Visit Summary for other counseling recommendations.  Return in about 4 weeks (around 11/09/2016) for ROB.   Roe Coombsachelle A Stella Bortle, CNM.

## 2016-10-12 NOTE — Patient Instructions (Addendum)

## 2016-10-13 LAB — CERVICOVAGINAL ANCILLARY ONLY
Chlamydia: NEGATIVE
NEISSERIA GONORRHEA: NEGATIVE
TRICH (WINDOWPATH): NEGATIVE

## 2016-10-17 LAB — CULTURE, OB URINE

## 2016-10-17 LAB — URINE CULTURE, OB REFLEX

## 2016-10-20 ENCOUNTER — Other Ambulatory Visit: Payer: Self-pay | Admitting: Certified Nurse Midwife

## 2016-10-20 ENCOUNTER — Encounter: Payer: Self-pay | Admitting: *Deleted

## 2016-10-20 DIAGNOSIS — O2342 Unspecified infection of urinary tract in pregnancy, second trimester: Secondary | ICD-10-CM

## 2016-10-20 MED ORDER — CEPHALEXIN 500 MG PO CAPS: 500.0000 mg | ORAL_CAPSULE | Freq: Every day | ORAL | 5 refills | Status: DC

## 2016-10-20 MED ORDER — CEPHALEXIN 500 MG PO CAPS: 500.0000 mg | ORAL_CAPSULE | Freq: Three times a day (TID) | ORAL | 0 refills | Status: DC

## 2016-10-26 LAB — AFP, SERUM, OPEN SPINA BIFIDA
AFP MOM: 1.01
AFP Value: 39.6 ng/mL
Gest. Age on Collection Date: 16.6 weeks
MATERNAL AGE AT EDD: 25.7 a
OSBR RISK 1 IN: 10000
TEST RESULTS AFP: NEGATIVE
Weight: 149 [lb_av]

## 2016-10-27 ENCOUNTER — Other Ambulatory Visit: Payer: Self-pay | Admitting: Certified Nurse Midwife

## 2016-10-27 DIAGNOSIS — Z3480 Encounter for supervision of other normal pregnancy, unspecified trimester: Secondary | ICD-10-CM

## 2016-10-28 ENCOUNTER — Other Ambulatory Visit: Payer: Self-pay | Admitting: Certified Nurse Midwife

## 2016-10-28 ENCOUNTER — Ambulatory Visit (HOSPITAL_COMMUNITY)
Admission: RE | Admit: 2016-10-28 | Discharge: 2016-10-28 | Disposition: A | Discharge status: Home / Self Care | Payer: Medicaid Other | Source: Ambulatory Visit | Attending: Certified Nurse Midwife | Admitting: Certified Nurse Midwife

## 2016-10-28 DIAGNOSIS — Z363 Encounter for antenatal screening for malformations: Secondary | ICD-10-CM

## 2016-10-28 DIAGNOSIS — Z3480 Encounter for supervision of other normal pregnancy, unspecified trimester: Secondary | ICD-10-CM

## 2016-10-28 DIAGNOSIS — Z3482 Encounter for supervision of other normal pregnancy, second trimester: Secondary | ICD-10-CM | POA: Diagnosis not present

## 2016-10-28 DIAGNOSIS — Z3A18 18 weeks gestation of pregnancy: Secondary | ICD-10-CM

## 2016-10-31 ENCOUNTER — Other Ambulatory Visit: Payer: Self-pay | Admitting: Certified Nurse Midwife

## 2016-10-31 DIAGNOSIS — Z3480 Encounter for supervision of other normal pregnancy, unspecified trimester: Secondary | ICD-10-CM

## 2016-11-09 ENCOUNTER — Encounter: Payer: Self-pay | Admitting: Certified Nurse Midwife

## 2016-11-09 ENCOUNTER — Ambulatory Visit (INDEPENDENT_AMBULATORY_CARE_PROVIDER_SITE_OTHER): Payer: Medicaid Other | Admitting: Certified Nurse Midwife

## 2016-11-09 VITALS — BP 130/86 | HR 98 | Wt 153.8 lb

## 2016-11-09 DIAGNOSIS — Z3482 Encounter for supervision of other normal pregnancy, second trimester: Secondary | ICD-10-CM

## 2016-11-09 DIAGNOSIS — E559 Vitamin D deficiency, unspecified: Secondary | ICD-10-CM

## 2016-11-09 DIAGNOSIS — R7989 Other specified abnormal findings of blood chemistry: Secondary | ICD-10-CM

## 2016-11-09 DIAGNOSIS — O2342 Unspecified infection of urinary tract in pregnancy, second trimester: Secondary | ICD-10-CM

## 2016-11-09 DIAGNOSIS — Z3480 Encounter for supervision of other normal pregnancy, unspecified trimester: Secondary | ICD-10-CM

## 2016-11-09 MED ORDER — CEPHALEXIN 500 MG PO CAPS: 500.0000 mg | ORAL_CAPSULE | Freq: Every day | ORAL | 5 refills | Status: DC

## 2016-11-09 NOTE — Progress Notes (Signed)
   PRENATAL VISIT NOTE  Subjective:  Sarah Collier is a 26 y.o. G3P0020 at [redacted]w[redacted]d being seen today for ongoing prenatal care.  She is currently monitored for the following issues for this low-risk pregnancy and has Supervision of other normal pregnancy, antepartum; Low vitamin D level; UTI (urinary tract infection) during pregnancy, second trimester; Trichomonal vaginitis during pregnancy in second trimester; and Mild tetrahydrocannabinol (THC) abuse on her problem list.  Patient reports no complaints.  Contractions: Not present. Vag. Bleeding: None.  Movement: Present. Denies leaking of fluid.   The following portions of the patient's history were reviewed and updated as appropriate: allergies, current medications, past family history, past medical history, past social history, past surgical history and problem list. Problem list updated.  Objective:   Vitals:   11/09/16 1004  BP: 130/86  Pulse: 98  Weight: 153 lb 12.8 oz (69.8 kg)    Fetal Status: Fetal Heart Rate (bpm): 145 Fundal Height: 20 cm Movement: Present     General:  Alert, oriented and cooperative. Patient is in no acute distress.  Skin: Skin is warm and dry. No rash noted.   Cardiovascular: Normal heart rate noted  Respiratory: Normal respiratory effort, no problems with respiration noted  Abdomen: Soft, gravid, appropriate for gestational age. Pain/Pressure: Absent     Pelvic:  Cervical exam deferred        Extremities: Normal range of motion.  Edema: None  Mental Status: Normal mood and affect. Normal behavior. Normal judgment and thought content.   Assessment and Plan:  Pregnancy: G3P0020 at [redacted]w[redacted]d  1. UTI (urinary tract infection) during pregnancy, second trimester      On suppression daily - cephALEXin (KEFLEX) 500 MG capsule; Take 1 capsule (500 mg total) by mouth daily. Start after completion of other antibiotics.  Dispense: 30 capsule; Refill: 5  2. Supervision of other normal pregnancy, antepartum  Doing well  3. Low vitamin D level    Taking weekly vitamin D  Preterm labor symptoms and general obstetric precautions including but not limited to vaginal bleeding, contractions, leaking of fluid and fetal movement were reviewed in detail with the patient. Please refer to After Visit Summary for other counseling recommendations.  Return in about 4 weeks (around 12/07/2016) for ROB.   Roe Coombs, CNM

## 2016-11-09 NOTE — Progress Notes (Signed)
Patient has been feeling tired and fatigued- just not herself.

## 2016-12-01 ENCOUNTER — Ambulatory Visit (HOSPITAL_COMMUNITY): Payer: Medicaid Other

## 2016-12-02 ENCOUNTER — Ambulatory Visit (HOSPITAL_COMMUNITY)
Admission: RE | Admit: 2016-12-02 | Discharge: 2016-12-02 | Disposition: A | Discharge status: Home / Self Care | Payer: Medicaid Other | Source: Ambulatory Visit | Attending: Certified Nurse Midwife | Admitting: Certified Nurse Midwife

## 2016-12-02 DIAGNOSIS — Z3A23 23 weeks gestation of pregnancy: Secondary | ICD-10-CM | POA: Insufficient documentation

## 2016-12-02 DIAGNOSIS — O321XX0 Maternal care for breech presentation, not applicable or unspecified: Secondary | ICD-10-CM | POA: Diagnosis not present

## 2016-12-02 DIAGNOSIS — O09892 Supervision of other high risk pregnancies, second trimester: Secondary | ICD-10-CM | POA: Diagnosis not present

## 2016-12-02 DIAGNOSIS — Z3480 Encounter for supervision of other normal pregnancy, unspecified trimester: Secondary | ICD-10-CM | POA: Diagnosis present

## 2016-12-02 DIAGNOSIS — Z362 Encounter for other antenatal screening follow-up: Secondary | ICD-10-CM | POA: Diagnosis present

## 2016-12-06 ENCOUNTER — Telehealth: Payer: Self-pay

## 2016-12-06 ENCOUNTER — Other Ambulatory Visit: Payer: Self-pay | Admitting: Certified Nurse Midwife

## 2016-12-06 DIAGNOSIS — Z3480 Encounter for supervision of other normal pregnancy, unspecified trimester: Secondary | ICD-10-CM

## 2016-12-06 NOTE — Telephone Encounter (Signed)
Returned call, pt had questions about her follow up U/S scheduled for 01-10-17, advised pt of upcoming appt in our office on 12-08-16.

## 2016-12-08 ENCOUNTER — Ambulatory Visit (INDEPENDENT_AMBULATORY_CARE_PROVIDER_SITE_OTHER): Payer: Medicaid Other | Admitting: Certified Nurse Midwife

## 2016-12-08 DIAGNOSIS — Z3480 Encounter for supervision of other normal pregnancy, unspecified trimester: Secondary | ICD-10-CM

## 2016-12-08 DIAGNOSIS — Z3482 Encounter for supervision of other normal pregnancy, second trimester: Secondary | ICD-10-CM

## 2016-12-08 NOTE — Progress Notes (Signed)
Patient reports good fetal movement, denies contractions. 

## 2016-12-08 NOTE — Progress Notes (Signed)
   PRENATAL VISIT NOTE  Subjective:  Sarah Collier is a 26 y.o. G3P0020 at 6475w5d being seen today for ongoing prenatal care.  She is currently monitored for the following issues for this low-risk pregnancy and has Supervision of other normal pregnancy, antepartum; Low vitamin D level; UTI (urinary tract infection) during pregnancy, second trimester; Trichomonal vaginitis during pregnancy in second trimester; and Mild tetrahydrocannabinol (THC) abuse on her problem list.  Patient reports no complaints.  Contractions: Irritability. Vag. Bleeding: None.  Movement: Present. Denies leaking of fluid.   The following portions of the patient's history were reviewed and updated as appropriate: allergies, current medications, past family history, past medical history, past social history, past surgical history and problem list. Problem list updated.  Objective:   Vitals:   12/08/16 1011  BP: 124/88  Pulse: (!) 108  Weight: 160 lb (72.6 kg)    Fetal Status: Fetal Heart Rate (bpm): 142 Fundal Height: 24 cm Movement: Present     General:  Alert, oriented and cooperative. Patient is in no acute distress.  Skin: Skin is warm and dry. No rash noted.   Cardiovascular: Normal heart rate noted  Respiratory: Normal respiratory effort, no problems with respiration noted  Abdomen: Soft, gravid, appropriate for gestational age. Pain/Pressure: Absent     Pelvic:  Cervical exam deferred        Extremities: Normal range of motion.  Edema: Trace  Mental Status: Normal mood and affect. Normal behavior. Normal judgment and thought content.   Assessment and Plan:  Pregnancy: G3P0020 at 1475w5d  1. Supervision of other normal pregnancy, antepartum  Patient reports doing well.     Preterm labor symptoms and general obstetric precautions including but not limited to vaginal bleeding, contractions, leaking of fluid and fetal movement were reviewed in detail with the patient. Please refer to After Visit Summary  for other counseling recommendations.  3 wk f/up ROB with 2 hour OGTT.    Roe Coombsenney, Rachelle A, CNM

## 2016-12-11 ENCOUNTER — Encounter: Payer: Self-pay | Admitting: Certified Nurse Midwife

## 2016-12-13 ENCOUNTER — Other Ambulatory Visit: Payer: Self-pay | Admitting: Certified Nurse Midwife

## 2016-12-13 DIAGNOSIS — R7989 Other specified abnormal findings of blood chemistry: Secondary | ICD-10-CM

## 2016-12-14 ENCOUNTER — Other Ambulatory Visit: Payer: Self-pay | Admitting: Certified Nurse Midwife

## 2016-12-14 NOTE — Telephone Encounter (Signed)
Please advise 

## 2016-12-14 NOTE — Telephone Encounter (Signed)
Please advise on refill Vitamin D request

## 2016-12-20 ENCOUNTER — Telehealth: Payer: Self-pay | Admitting: *Deleted

## 2016-12-20 NOTE — Telephone Encounter (Signed)
Patient is requesting a refill of her nausea medication.

## 2016-12-21 ENCOUNTER — Other Ambulatory Visit: Payer: Self-pay | Admitting: Certified Nurse Midwife

## 2016-12-21 DIAGNOSIS — Z3480 Encounter for supervision of other normal pregnancy, unspecified trimester: Secondary | ICD-10-CM

## 2016-12-21 DIAGNOSIS — O219 Vomiting of pregnancy, unspecified: Secondary | ICD-10-CM

## 2016-12-21 MED ORDER — DOXYLAMINE-PYRIDOXINE 10-10 MG PO TBEC: DELAYED_RELEASE_TABLET | ORAL | 4 refills | Status: DC

## 2016-12-21 MED ORDER — ONDANSETRON HCL 8 MG PO TABS
8.0000 mg | ORAL_TABLET | Freq: Three times a day (TID) | ORAL | 2 refills | Status: DC | PRN
Start: 2016-12-21 — End: 2017-01-24

## 2016-12-21 NOTE — Telephone Encounter (Signed)
Please let her know that both Diclegis and Zofran were sent to the pharmacy for her to take.  Thank you.  R.Heliodoro Domagalski CNM

## 2016-12-30 ENCOUNTER — Encounter: Payer: Self-pay | Admitting: Certified Nurse Midwife

## 2016-12-30 ENCOUNTER — Ambulatory Visit: Payer: Medicaid Other | Admitting: Certified Nurse Midwife

## 2016-12-30 ENCOUNTER — Other Ambulatory Visit (INDEPENDENT_AMBULATORY_CARE_PROVIDER_SITE_OTHER): Payer: Medicaid Other

## 2016-12-30 VITALS — Wt 166.4 lb

## 2016-12-30 DIAGNOSIS — Z3480 Encounter for supervision of other normal pregnancy, unspecified trimester: Secondary | ICD-10-CM

## 2016-12-30 DIAGNOSIS — M549 Dorsalgia, unspecified: Secondary | ICD-10-CM

## 2016-12-30 DIAGNOSIS — O99891 Other specified diseases and conditions complicating pregnancy: Secondary | ICD-10-CM

## 2016-12-30 DIAGNOSIS — O9989 Other specified diseases and conditions complicating pregnancy, childbirth and the puerperium: Secondary | ICD-10-CM

## 2016-12-30 DIAGNOSIS — R7989 Other specified abnormal findings of blood chemistry: Secondary | ICD-10-CM

## 2016-12-30 DIAGNOSIS — Z3493 Encounter for supervision of normal pregnancy, unspecified, third trimester: Secondary | ICD-10-CM

## 2016-12-30 MED ORDER — COMFORT FIT MATERNITY SUPP LG MISC: 1.0000 [IU] | Freq: Every day | 0 refills | Status: DC

## 2016-12-30 NOTE — Progress Notes (Addendum)
   PRENATAL VISIT NOTE  Subjective:  Sarah Collier is a 26 y.o. G3P0020 at 4719w6d being seen today for ongoing prenatal care.  She is currently monitored for the following issues for this low-risk pregnancy and has Supervision of other normal pregnancy, antepartum; Low vitamin D level; UTI (urinary tract infection) during pregnancy, second trimester; Trichomonal vaginitis during pregnancy in second trimester; and Mild tetrahydrocannabinol (THC) abuse on her problem list.  Patient reports no complaints.  Contractions: Not present. Vag. Bleeding: None.  Movement: Present. Denies leaking of fluid.   The following portions of the patient's history were reviewed and updated as appropriate: allergies, current medications, past family history, past medical history, past social history, past surgical history and problem list. Problem list updated.  Objective:   Vitals:   12/30/16 0927  Weight: 166 lb 6.4 oz (75.5 kg)    Fetal Status: Fetal Heart Rate (bpm): 135 Fundal Height: 28 cm Movement: Present     General:  Alert, oriented and cooperative. Patient is in no acute distress.  Skin: Skin is warm and dry. No rash noted.   Cardiovascular: Normal heart rate noted  Respiratory: Normal respiratory effort, no problems with respiration noted  Abdomen: Soft, gravid, appropriate for gestational age. Pain/Pressure: Absent     Pelvic:  Cervical exam deferred        Extremities: Normal range of motion.  Edema: None  Mental Status: Normal mood and affect. Normal behavior. Normal judgment and thought content.   Assessment and Plan:  Pregnancy: G3P0020 at 4919w6d  1. Supervision of other normal pregnancy, antepartum     Doing well.  TOC UTI today. Low back pain: Rx maternity support belt.       FMLA paperwork discussed, would like to work as long as possible.  - Glucose Tolerance, 2 Hours w/1 Hour - HIV antibody - RPR - CBC - Culture, OB Urine  2. Low vitamin D level     Taking weekly vitamin D.     Preterm labor symptoms and general obstetric precautions including but not limited to vaginal bleeding, contractions, leaking of fluid and fetal movement were reviewed in detail with the patient. Please refer to After Visit Summary for other counseling recommendations.  Return in about 2 weeks (around 01/13/2017) for ROB.   [name], CNM

## 2016-12-30 NOTE — Addendum Note (Signed)
Addended by: Samantha CrimesENNEY, [place] ANNE on: 12/30/2016 10:07 AM   Modules accepted: Orders

## 2016-12-31 LAB — CBC
HEMATOCRIT: 35.7 % (ref 34.0–46.6)
HEMOGLOBIN: 11.7 g/dL (ref 11.1–15.9)
MCH: 29.9 pg (ref 26.6–33.0)
MCHC: 32.8 g/dL (ref 31.5–35.7)
MCV: 91 fL (ref 79–97)
Platelets: 254 10*3/uL (ref 150–379)
RBC: 3.91 x10E6/uL (ref 3.77–5.28)
RDW: 13.3 % (ref 12.3–15.4)
WBC: 9.2 10*3/uL (ref 3.4–10.8)

## 2016-12-31 LAB — RPR: RPR: NONREACTIVE

## 2016-12-31 LAB — HIV ANTIBODY (ROUTINE TESTING W REFLEX): HIV Screen 4th Generation wRfx: NONREACTIVE

## 2016-12-31 LAB — GLUCOSE TOLERANCE, 2 HOURS W/ 1HR
GLUCOSE, FASTING: 82 mg/dL (ref 65–91)
Glucose, 1 hour: 157 mg/dL (ref 65–179)
Glucose, 2 hour: 118 mg/dL (ref 65–152)

## 2017-01-01 LAB — CULTURE, OB URINE

## 2017-01-01 LAB — URINE CULTURE, OB REFLEX: ORGANISM ID, BACTERIA: NO GROWTH

## 2017-01-02 ENCOUNTER — Other Ambulatory Visit: Payer: Self-pay | Admitting: Certified Nurse Midwife

## 2017-01-02 DIAGNOSIS — Z3480 Encounter for supervision of other normal pregnancy, unspecified trimester: Secondary | ICD-10-CM

## 2017-01-10 ENCOUNTER — Ambulatory Visit (HOSPITAL_COMMUNITY)
Admission: RE | Admit: 2017-01-10 | Discharge: 2017-01-10 | Disposition: A | Discharge status: Home / Self Care | Payer: Medicaid Other | Source: Ambulatory Visit | Attending: Certified Nurse Midwife | Admitting: Certified Nurse Midwife

## 2017-01-10 DIAGNOSIS — Z3A29 29 weeks gestation of pregnancy: Secondary | ICD-10-CM | POA: Diagnosis not present

## 2017-01-10 DIAGNOSIS — Z3689 Encounter for other specified antenatal screening: Secondary | ICD-10-CM | POA: Insufficient documentation

## 2017-01-10 DIAGNOSIS — Z3480 Encounter for supervision of other normal pregnancy, unspecified trimester: Secondary | ICD-10-CM

## 2017-01-10 DIAGNOSIS — O321XX0 Maternal care for breech presentation, not applicable or unspecified: Secondary | ICD-10-CM | POA: Diagnosis not present

## 2017-01-11 ENCOUNTER — Other Ambulatory Visit: Payer: Self-pay | Admitting: Certified Nurse Midwife

## 2017-01-11 DIAGNOSIS — Z3480 Encounter for supervision of other normal pregnancy, unspecified trimester: Secondary | ICD-10-CM

## 2017-01-16 ENCOUNTER — Ambulatory Visit (INDEPENDENT_AMBULATORY_CARE_PROVIDER_SITE_OTHER): Payer: Medicaid Other | Admitting: Certified Nurse Midwife

## 2017-01-16 VITALS — BP 131/90 | HR 114 | Wt 165.6 lb

## 2017-01-16 DIAGNOSIS — O365930 Maternal care for other known or suspected poor fetal growth, third trimester, not applicable or unspecified: Secondary | ICD-10-CM

## 2017-01-16 DIAGNOSIS — O2342 Unspecified infection of urinary tract in pregnancy, second trimester: Secondary | ICD-10-CM

## 2017-01-16 DIAGNOSIS — Z3480 Encounter for supervision of other normal pregnancy, unspecified trimester: Secondary | ICD-10-CM

## 2017-01-16 DIAGNOSIS — O163 Unspecified maternal hypertension, third trimester: Secondary | ICD-10-CM

## 2017-01-16 NOTE — Progress Notes (Signed)
O2 sat 98- Patient states her SOB is getting worse. She also wants to know if she is to continue the Keflex daily.

## 2017-01-16 NOTE — Patient Instructions (Signed)
AREA PEDIATRIC/FAMILY PRACTICE PHYSICIANS  [facility] [address], [address] [place], [place]  [address] Phone - [phone]   Fax - [phone]  [facility] [address] [place], [place] [address] Phone - [phone]   Fax - [phone]  [facility] [address] [place], [place]  [address] Phone - [phone]   Fax - [phone]  [facility] [address] [place], [place]  [address] Phone - [phone]   Fax - [phone]  [facility] [address] [place], [place]  [address] Phone - [phone]   Fax - [phone]  [facility] [address], [address] [place], [place]  [address] Phone - [phone]   Fax - [phone]  [facility] [address] [place], [place]  [address] Phone - [phone]   Fax - [phone]  [facility] [address], [address] [place], [place]  [address] Phone - [phone]   Fax - [phone]  [facility] [address] [place], [place]  [address] Phone - [phone]   Fax - [phone] [facility] [address] [place], [place]  [address] Phone - [phone]   Fax - [phone]  [facility] [address] [place], [place]  [address] Phone - [phone]   Fax - [phone]  [facility] [address] [place], [place]  [address] Phone - [phone]   Fax - [phone]  [facility] [address] [place], [place]  [address] Phone - [phone]   Fax - [phone]  [facility] [address] [place], [place]  [address] Phone - [phone]  [facility] [address] [place], [place]  [address] Phone - [phone]   Fax - [phone]  [facility] [address] [place], [place]  [address] Phone - [phone]   Fax - [phone]  [facility] [address] [place], [place]  [address] Phone - [phone]   Fax - [phone]  [facility] [address] [place], [place]  [address] Phone - [phone]   Fax - [phone]   [facility] [address] [place], [place]  [address] Phone - [phone]   Fax - [phone]  [facility] [address] [place], [place]  [address] Phone - [phone]   Fax - [phone]  [facility] [address] [place], [place]  [address] Phone - [phone]   Fax - [phone]  [facility] [address] [place], [place]  [address] Phone - [phone]   Fax - [phone]  [facility] [address] [place], [place]  [address] Phone - [phone]   Fax - [phone] [facility] [address] [place], [place]  [address] Phone - [phone]   Fax - [phone]  [facility] [address] [place], [place]  [address] Phone - [phone]   Fax - [phone]  [facility] [address] [place], [place]  [address] Phone - [phone]   Fax - [phone]  [facility] Charlene Flemming MD [address] [place]  [address] Phone [phone]  Fax [phone]   

## 2017-01-16 NOTE — Progress Notes (Signed)
   PRENATAL VISIT NOTE  Subjective:  Sarah Collier is a 26 y.o. G3P0020 at 78w2dbeing seen today for ongoing prenatal care.  She is currently monitored for the following issues for this low-risk pregnancy and has Supervision of other normal pregnancy, antepartum; Low vitamin D level; UTI (urinary tract infection) during pregnancy, second trimester; Trichomonal vaginitis during pregnancy in second trimester; and Mild tetrahydrocannabinol (THC) abuse on her problem list.  Patient reports no bleeding, no contractions, no cramping, no leaking and SOB at night when trying to sleep flat, pillows encouraged.  Contractions: Not present. Vag. Bleeding: None.  Movement: Present. Denies leaking of fluid.   The following portions of the patient's history were reviewed and updated as appropriate: allergies, current medications, past family history, past medical history, past social history, past surgical history and problem list. Problem list updated.  Objective:   Vitals:   01/16/17 1053  BP: 131/90  Pulse: (!) 114  Weight: 165 lb 9.6 oz (75.1 kg)    Fetal Status: Fetal Heart Rate (bpm): 136 Fundal Height: 29 cm Movement: Present     General:  Alert, oriented and cooperative. Patient is in no acute distress.  Skin: Skin is warm and dry. No rash noted.   Cardiovascular: Normal heart rate noted  Respiratory: Normal respiratory effort, no problems with respiration noted  Abdomen: Soft, gravid, appropriate for gestational age. Pain/Pressure: Absent     Pelvic:  Cervical exam deferred        Extremities: Normal range of motion.  Edema: None  Mental Status: Normal mood and affect. Normal behavior. Normal judgment and thought content.   Assessment and Plan:  Pregnancy: G3P0020 at 349w2d1. UTI (urinary tract infection) during pregnancy, second trimester     Keflex for suppression, last UC negative 12/30/16  2. Supervision of other normal pregnancy, antepartum      Elevated blood pressure  today  3. Elevated blood pressure complicating pregnancy in third trimester, antepartum      - Comp Met (CMET) - CBC - Protein / creatinine ratio, urine - Creatinine clearance, urine, 24 hour; Future - Protein, urine, 24 hour; Future  4. Encounter for maternal care for poor fetal growth in singleton pregnancy in third trimester     Lagging long bones on [place] US[place]FM OB FOLLOW UP; Future  Preterm labor symptoms and general obstetric precautions including but not limited to vaginal bleeding, contractions, leaking of fluid and fetal movement were reviewed in detail with the patient. Please refer to After Visit Summary for other counseling recommendations.  Return in about 1 week (around 01/23/2017) for ROB.   RaMorene CrockerCNM

## 2017-01-17 LAB — COMPREHENSIVE METABOLIC PANEL
ALK PHOS: 72 IU/L (ref 39–117)
ALT: 15 IU/L (ref 0–32)
AST: 13 IU/L (ref 0–40)
Albumin/Globulin Ratio: 1.2 (ref 1.2–2.2)
Albumin: 3.6 g/dL (ref 3.5–5.5)
BUN/Creatinine Ratio: 13 (ref 9–23)
BUN: 8 mg/dL (ref 6–20)
Bilirubin Total: <0.2 mg/dL (ref 0.0–1.2)
CALCIUM: 9.5 mg/dL (ref 8.7–10.2)
CO2: 20 mmol/L (ref 20–29)
CREATININE: 0.61 mg/dL (ref 0.57–1.00)
Chloride: 101 mmol/L (ref 96–106)
GFR calc Af Amer: 146 mL/min/{1.73_m2} (ref >59)
GFR, EST NON AFRICAN AMERICAN: 126 mL/min/{1.73_m2} (ref >59)
GLOBULIN, TOTAL: 3 g/dL (ref 1.5–4.5)
GLUCOSE: 123 mg/dL — AB (ref 65–99)
Potassium: 4 mmol/L (ref 3.5–5.2)
SODIUM: 138 mmol/L (ref 134–144)
Total Protein: 6.6 g/dL (ref 6.0–8.5)

## 2017-01-17 LAB — PROTEIN / CREATININE RATIO, URINE
Creatinine, Urine: 249.2 mg/dL
PROTEIN/CREAT RATIO: 129 mg/g{creat} (ref 0–200)
Protein, Ur: 32.2 mg/dL

## 2017-01-17 LAB — CBC
HEMATOCRIT: 36.1 % (ref 34.0–46.6)
HEMOGLOBIN: 12 g/dL (ref 11.1–15.9)
MCH: 29.1 pg (ref 26.6–33.0)
MCHC: 33.2 g/dL (ref 31.5–35.7)
MCV: 88 fL (ref 79–97)
PLATELETS: 254 10*3/uL (ref 150–379)
RBC: 4.12 x10E6/uL (ref 3.77–5.28)
RDW: 13.9 % (ref 12.3–15.4)
WBC: 7 10*3/uL (ref 3.4–10.8)

## 2017-01-19 ENCOUNTER — Encounter: Payer: Self-pay | Admitting: *Deleted

## 2017-01-19 ENCOUNTER — Other Ambulatory Visit: Payer: Self-pay | Admitting: Obstetrics

## 2017-01-19 DIAGNOSIS — R7989 Other specified abnormal findings of blood chemistry: Secondary | ICD-10-CM

## 2017-01-20 NOTE — Addendum Note (Signed)
Addended by: Burnell BlanksMASHBURN, Tationa Stech on: 01/20/2017 08:21 AM   Modules accepted: Orders

## 2017-01-21 LAB — CREATININE CLEARANCE, URINE, 24 HOUR
CREATININE 24H UR: 1013 mg/(24.h) (ref 800–1800)
CREATININE, UR: 101.3 mg/dL
CREATININE: 0.64 mg/dL (ref 0.57–1.00)
Creatinine Clearance: 110 mL/min (ref 88–128)
GFR, EST AFRICAN AMERICAN: 143 mL/min/{1.73_m2} (ref >59)
GFR, EST NON AFRICAN AMERICAN: 124 mL/min/{1.73_m2} (ref >59)

## 2017-01-21 LAB — PROTEIN, URINE, 24 HOUR
PROTEIN 24H UR: 149 mg/(24.h) (ref 30–150)
Protein, Ur: 14.9 mg/dL

## 2017-01-24 ENCOUNTER — Other Ambulatory Visit: Payer: Self-pay | Admitting: Certified Nurse Midwife

## 2017-01-24 ENCOUNTER — Ambulatory Visit (INDEPENDENT_AMBULATORY_CARE_PROVIDER_SITE_OTHER): Payer: Medicaid Other | Admitting: Certified Nurse Midwife

## 2017-01-24 VITALS — BP 134/88 | HR 103 | Wt 166.8 lb

## 2017-01-24 DIAGNOSIS — Z3483 Encounter for supervision of other normal pregnancy, third trimester: Secondary | ICD-10-CM

## 2017-01-24 DIAGNOSIS — Z3480 Encounter for supervision of other normal pregnancy, unspecified trimester: Secondary | ICD-10-CM

## 2017-01-24 DIAGNOSIS — R7989 Other specified abnormal findings of blood chemistry: Secondary | ICD-10-CM

## 2017-01-24 DIAGNOSIS — E559 Vitamin D deficiency, unspecified: Secondary | ICD-10-CM

## 2017-01-24 NOTE — Progress Notes (Signed)
   PRENATAL VISIT NOTE  Subjective:  Sarah Collier is a 26 y.o. G3P0020 at 6392w3d being seen today for ongoing prenatal care.  She is currently monitored for the following issues for this low-risk pregnancy and has Supervision of other normal pregnancy, antepartum; Low vitamin D level; UTI (urinary tract infection) during pregnancy, second trimester; Trichomonal vaginitis during pregnancy in second trimester; and Mild tetrahydrocannabinol (THC) abuse on her problem list.  Patient reports no complaints.  Contractions: Not present.  .  Movement: Present. Denies leaking of fluid.   The following portions of the patient's history were reviewed and updated as appropriate: allergies, current medications, past family history, past medical history, past social history, past surgical history and problem list. Problem list updated.  Objective:  There were no vitals filed for this visit.  Fetal Status: Fetal Heart Rate (bpm): 134 Fundal Height: 32 cm Movement: Present  Presentation: Vertex  General:  Alert, oriented and cooperative. Patient is in no acute distress.  Skin: Skin is warm and dry. No rash noted.   Cardiovascular: Normal heart rate noted  Respiratory: Normal respiratory effort, no problems with respiration noted  Abdomen: Soft, gravid, appropriate for gestational age. Pain/Pressure: Absent     Pelvic:  Cervical exam deferred        Extremities: Normal range of motion.     Mental Status: Normal mood and affect. Normal behavior. Normal judgment and thought content.   Assessment and Plan:  Pregnancy: G3P0020 at 2892w3d  1. Supervision of other normal pregnancy, antepartum     Doing well  2. Low vitamin D level     Taking weekly vitamin D  Preterm labor symptoms and general obstetric precautions including but not limited to vaginal bleeding, contractions, leaking of fluid and fetal movement were reviewed in detail with the patient. Please refer to After Visit Summary for other  counseling recommendations.  Return in about 2 weeks (around 02/07/2017) for ROB.   Roe Coombsachelle A Euva Rundell, CNM

## 2017-01-24 NOTE — Patient Instructions (Signed)
AREA PEDIATRIC/FAMILY PRACTICE PHYSICIANS  [facility] [address], [address] [place], [place]  [address] Phone - [phone]   Fax - [phone]  [facility] [address] [place], [place] [address] Phone - [phone]   Fax - [phone]  [facility] [address] [place], [place]  [address] Phone - [phone]   Fax - [phone]  [facility] [address] [place], [place]  [address] Phone - [phone]   Fax - [phone]  [facility] [address] [place], [place]  [address] Phone - [phone]   Fax - [phone]  [facility] [address], [address] [place], [place]  [address] Phone - [phone]   Fax - [phone]  [facility] [address] [place]  [address] Phone - [phone]   Fax - [phone]  [facility] [address] [place], [place]  [address] Phone - [phone]   Fax - [phone]  [facility] [address] [place], [place]  [address] Phone - [phone]   Fax - [phone] [facility] [address] [place], [place]  [address] Phone - [phone]   Fax - [phone]  [facility] [address] [place], [place]  [address] Phone - [phone]   Fax - [phone]  [facility] [address] [place], [place]  [address] Phone - [phone]   Fax - [phone]  [facility] [address], [address] [place], [place]  [address] Phone - [phone]   Fax - [phone]  [facility]OSR[facility] [address] [place], [place]  [address] Phone - [phone]  [facility] [address] [place], [place]  [address] Phone - [phone]   Fax - [phone]  [facility] [address] [place], [place]  [address] Phone - [phone]   Fax - [phone]  [facility] [address] [place], [place]  [address] Phone - [phone]   Fax - [phone]  [facility] [address] [place], [place]  [address] Phone - [phone]   Fax - [phone]   [facility] [address] [place], [place]  [address] Phone - [phone]   Fax - [phone]  [facility] [address] , [place]  [address] Phone - [phone]   Fax - [phone]  [facility] [address] , [place]  [address] Phone - [phone]   Fax - [phone]  [facility] [address] , [place]  [address] Phone - [phone]   Fax - [phone]  [facility] [address] , [place]  [address] Phone - [phone]   Fax - [phone] [facility] [address] [place], [place]  [address] Phone - [phone]   Fax - [phone]  [facility] [address] [place],   [address] Phone - [phone]   Fax - [phone]  [facility] [address] [place],   [address] Phone - [phone]   Fax - [phone]  [facility] Charlene Flemming MD [address] [place]  [address] Phone [phone]  Fax [phone]   

## 2017-02-08 ENCOUNTER — Ambulatory Visit (INDEPENDENT_AMBULATORY_CARE_PROVIDER_SITE_OTHER): Payer: Medicaid Other | Admitting: Certified Nurse Midwife

## 2017-02-08 ENCOUNTER — Encounter: Payer: Self-pay | Admitting: *Deleted

## 2017-02-08 VITALS — BP 140/83 | HR 123 | Wt 168.8 lb

## 2017-02-08 DIAGNOSIS — Z3483 Encounter for supervision of other normal pregnancy, third trimester: Secondary | ICD-10-CM

## 2017-02-08 DIAGNOSIS — O163 Unspecified maternal hypertension, third trimester: Secondary | ICD-10-CM

## 2017-02-08 DIAGNOSIS — O2343 Unspecified infection of urinary tract in pregnancy, third trimester: Secondary | ICD-10-CM

## 2017-02-08 DIAGNOSIS — Z3480 Encounter for supervision of other normal pregnancy, unspecified trimester: Secondary | ICD-10-CM

## 2017-02-08 DIAGNOSIS — O2342 Unspecified infection of urinary tract in pregnancy, second trimester: Secondary | ICD-10-CM

## 2017-02-08 DIAGNOSIS — R7989 Other specified abnormal findings of blood chemistry: Secondary | ICD-10-CM

## 2017-02-08 DIAGNOSIS — E559 Vitamin D deficiency, unspecified: Secondary | ICD-10-CM

## 2017-02-08 NOTE — Progress Notes (Signed)
   PRENATAL VISIT NOTE  Subjective:  Sarah Collier is a 26 y.o. G3P0020 at 60w4dbeing seen today for ongoing prenatal care.  She is currently monitored for the following issues for this low-risk pregnancy and has Supervision of other normal pregnancy, antepartum; Low vitamin D level; UTI (urinary tract infection) during pregnancy, second trimester; Trichomonal vaginitis during pregnancy in second trimester; and Mild tetrahydrocannabinol (THC) abuse on her problem list.  Patient reports no bleeding, no leaking, occasional contractions and uri symptoms. URI symptoms, denies fever, reports nasal congestion, minimal coughing, no sputum production   Contractions: Not present. Vag. Bleeding: None.  Movement: Present. Denies leaking of fluid.   The following portions of the patient's history were reviewed and updated as appropriate: allergies, current medications, past family history, past medical history, past social history, past surgical history and problem list. Problem list updated.  Objective:   Vitals:   02/08/17 1450  BP: 140/83  Pulse: (!) 123  Weight: 168 lb 12.8 oz (76.6 kg)    Fetal Status: Fetal Heart Rate (bpm): 135 Fundal Height: 33 cm Movement: Present     General:  Alert, oriented and cooperative. Patient is in no acute distress.  Skin: Skin is warm and dry. No rash noted.   Cardiovascular: Normal heart rate noted  Respiratory: Normal respiratory effort, no problems with respiration noted  Abdomen: Soft, gravid, appropriate for gestational age. Pain/Pressure: Absent     Pelvic:  Cervical exam deferred        Extremities: Normal range of motion.  Edema: None  Mental Status: Normal mood and affect. Normal behavior. Normal judgment and thought content.   Assessment and Plan:  Pregnancy: [id number] 346w4d1. Supervision of other normal pregnancy, antepartum     Acute URI, FOB was ill.  Out of work this week d/t illness.  Decrease work hours d/t illness, increased blood  pressure to no more than 8 hours/day.    2. UTI (urinary tract infection) during pregnancy, second trimester     On Keflex suppression  3. Low vitamin D level     Taking vitamin D  4. Elevated blood pressure affecting pregnancy in third trimester, antepartum       - Comp Met (CMET) - CBC - Protein / creatinine ratio, urine  Preterm labor symptoms and general obstetric precautions including but not limited to vaginal bleeding, contractions, leaking of fluid and fetal movement were reviewed in detail with the patient. Please refer to After Visit Summary for other counseling recommendations.  Return in about 2 weeks (around 02/22/2017) for ROB, GBS. Blood pressure check Friday.    RaMorene CrockerCNM

## 2017-02-08 NOTE — Progress Notes (Signed)
Patient is having leg pain at work- patient is working 12 hours.

## 2017-02-09 LAB — COMPREHENSIVE METABOLIC PANEL
ALT: 10 IU/L (ref 0–32)
AST: 15 IU/L (ref 0–40)
Albumin/Globulin Ratio: 1.3 (ref 1.2–2.2)
Albumin: 3.7 g/dL (ref 3.5–5.5)
Alkaline Phosphatase: 95 IU/L (ref 39–117)
BUN/Creatinine Ratio: 11 (ref 9–23)
BUN: 7 mg/dL (ref 6–20)
CHLORIDE: 103 mmol/L (ref 96–106)
CO2: 19 mmol/L — AB (ref 20–29)
CREATININE: 0.61 mg/dL (ref 0.57–1.00)
Calcium: 9.6 mg/dL (ref 8.7–10.2)
GFR calc non Af Amer: 126 mL/min/{1.73_m2} (ref >59)
GFR, EST AFRICAN AMERICAN: 146 mL/min/{1.73_m2} (ref >59)
GLUCOSE: 129 mg/dL — AB (ref 65–99)
Globulin, Total: 2.9 g/dL (ref 1.5–4.5)
Potassium: 3.9 mmol/L (ref 3.5–5.2)
Sodium: 138 mmol/L (ref 134–144)
TOTAL PROTEIN: 6.6 g/dL (ref 6.0–8.5)

## 2017-02-09 LAB — PROTEIN / CREATININE RATIO, URINE
Creatinine, Urine: 271.9 mg/dL
PROTEIN UR: 30.9 mg/dL
PROTEIN/CREAT RATIO: 114 mg/g{creat} (ref 0–200)

## 2017-02-09 LAB — CBC
HEMATOCRIT: 37.3 % (ref 34.0–46.6)
HEMOGLOBIN: 12.4 g/dL (ref 11.1–15.9)
MCH: 29.1 pg (ref 26.6–33.0)
MCHC: 33.2 g/dL (ref 31.5–35.7)
MCV: 88 fL (ref 79–97)
Platelets: 251 10*3/uL (ref 150–379)
RBC: 4.26 x10E6/uL (ref 3.77–5.28)
RDW: 13.9 % (ref 12.3–15.4)
WBC: 7.3 10*3/uL (ref 3.4–10.8)

## 2017-02-10 ENCOUNTER — Ambulatory Visit: Payer: Medicaid Other

## 2017-02-10 ENCOUNTER — Other Ambulatory Visit: Payer: Self-pay | Admitting: Certified Nurse Midwife

## 2017-02-10 VITALS — BP 132/87 | HR 108

## 2017-02-10 DIAGNOSIS — Z0130 Encounter for examination of blood pressure without abnormal findings: Secondary | ICD-10-CM

## 2017-02-10 DIAGNOSIS — Z3480 Encounter for supervision of other normal pregnancy, unspecified trimester: Secondary | ICD-10-CM

## 2017-02-10 NOTE — Progress Notes (Signed)
Pt here for blood pressure check. BP today is 132/87 pulse 108. Results reviewed with Rachelle. Rachelle states for pt to return for next OB.

## 2017-02-16 ENCOUNTER — Ambulatory Visit (HOSPITAL_COMMUNITY)
Admission: RE | Admit: 2017-02-16 | Discharge: 2017-02-16 | Disposition: A | Discharge status: Home / Self Care | Payer: Medicaid Other | Source: Ambulatory Visit | Attending: Certified Nurse Midwife | Admitting: Certified Nurse Midwife

## 2017-02-16 ENCOUNTER — Other Ambulatory Visit: Payer: Self-pay | Admitting: Certified Nurse Midwife

## 2017-02-16 DIAGNOSIS — O133 Gestational [pregnancy-induced] hypertension without significant proteinuria, third trimester: Secondary | ICD-10-CM | POA: Diagnosis not present

## 2017-02-16 DIAGNOSIS — O163 Unspecified maternal hypertension, third trimester: Secondary | ICD-10-CM

## 2017-02-16 DIAGNOSIS — O365930 Maternal care for other known or suspected poor fetal growth, third trimester, not applicable or unspecified: Secondary | ICD-10-CM | POA: Diagnosis not present

## 2017-02-16 DIAGNOSIS — Z362 Encounter for other antenatal screening follow-up: Secondary | ICD-10-CM | POA: Diagnosis not present

## 2017-02-16 DIAGNOSIS — Z3A34 34 weeks gestation of pregnancy: Secondary | ICD-10-CM | POA: Diagnosis not present

## 2017-02-16 DIAGNOSIS — O99323 Drug use complicating pregnancy, third trimester: Secondary | ICD-10-CM | POA: Diagnosis present

## 2017-02-22 ENCOUNTER — Other Ambulatory Visit (HOSPITAL_COMMUNITY)
Admission: RE | Admit: 2017-02-22 | Discharge: 2017-02-22 | Disposition: A | Discharge status: Home / Self Care | Payer: Medicaid Other | Source: Ambulatory Visit | Attending: Certified Nurse Midwife | Admitting: Certified Nurse Midwife

## 2017-02-22 ENCOUNTER — Encounter: Payer: Self-pay | Admitting: *Deleted

## 2017-02-22 ENCOUNTER — Ambulatory Visit (INDEPENDENT_AMBULATORY_CARE_PROVIDER_SITE_OTHER): Payer: Medicaid Other | Admitting: Certified Nurse Midwife

## 2017-02-22 VITALS — BP 138/100 | HR 106 | Wt 174.0 lb

## 2017-02-22 DIAGNOSIS — R7989 Other specified abnormal findings of blood chemistry: Secondary | ICD-10-CM

## 2017-02-22 DIAGNOSIS — Z3483 Encounter for supervision of other normal pregnancy, third trimester: Secondary | ICD-10-CM

## 2017-02-22 DIAGNOSIS — Z3480 Encounter for supervision of other normal pregnancy, unspecified trimester: Secondary | ICD-10-CM | POA: Insufficient documentation

## 2017-02-22 DIAGNOSIS — O10913 Unspecified pre-existing hypertension complicating pregnancy, third trimester: Secondary | ICD-10-CM

## 2017-02-22 DIAGNOSIS — E559 Vitamin D deficiency, unspecified: Secondary | ICD-10-CM

## 2017-02-22 DIAGNOSIS — O0990 Supervision of high risk pregnancy, unspecified, unspecified trimester: Secondary | ICD-10-CM

## 2017-02-22 DIAGNOSIS — O0993 Supervision of high risk pregnancy, unspecified, third trimester: Secondary | ICD-10-CM

## 2017-02-22 DIAGNOSIS — O10919 Unspecified pre-existing hypertension complicating pregnancy, unspecified trimester: Secondary | ICD-10-CM

## 2017-02-22 LAB — OB RESULTS CONSOLE GC/CHLAMYDIA: GC PROBE AMP, GENITAL: NEGATIVE

## 2017-02-22 LAB — OB RESULTS CONSOLE GBS: GBS: NEGATIVE

## 2017-02-22 NOTE — Progress Notes (Signed)
Manual BP reading. Please review last u/s and discuss with pt.

## 2017-02-23 ENCOUNTER — Encounter: Payer: Self-pay | Admitting: Certified Nurse Midwife

## 2017-02-23 DIAGNOSIS — O10919 Unspecified pre-existing hypertension complicating pregnancy, unspecified trimester: Secondary | ICD-10-CM | POA: Insufficient documentation

## 2017-02-23 LAB — CBC
Hematocrit: 36.7 % (ref 34.0–46.6)
Hemoglobin: 12.5 g/dL (ref 11.1–15.9)
MCH: 29.7 pg (ref 26.6–33.0)
MCHC: 34.1 g/dL (ref 31.5–35.7)
MCV: 87 fL (ref 79–97)
PLATELETS: 274 10*3/uL (ref 150–379)
RBC: 4.21 x10E6/uL (ref 3.77–5.28)
RDW: 13.8 % (ref 12.3–15.4)
WBC: 8.1 10*3/uL (ref 3.4–10.8)

## 2017-02-23 LAB — ALT: ALT: 10 IU/L (ref 0–32)

## 2017-02-23 LAB — CREATININE, SERUM
CREATININE: 0.59 mg/dL (ref 0.57–1.00)
GFR, EST AFRICAN AMERICAN: 147 mL/min/{1.73_m2} (ref >59)
GFR, EST NON AFRICAN AMERICAN: 128 mL/min/{1.73_m2} (ref >59)

## 2017-02-23 LAB — AST: AST: 16 IU/L (ref 0–40)

## 2017-02-23 LAB — LACTATE DEHYDROGENASE: LDH: 177 IU/L (ref 119–226)

## 2017-02-23 LAB — PROTEIN / CREATININE RATIO, URINE
CREATININE, UR: 171.4 mg/dL
Protein, Ur: 21.4 mg/dL
Protein/Creat Ratio: 125 mg/g creat (ref 0–200)

## 2017-02-23 MED ORDER — VITAMIN D (ERGOCALCIFEROL) 1.25 MG (50000 UNIT) PO CAPS: 50000.0000 [IU] | ORAL_CAPSULE | ORAL | 2 refills | Status: DC

## 2017-02-23 NOTE — Progress Notes (Signed)
   PRENATAL VISIT NOTE  Subjective:  Sarah Collier is a 26 y.o. G3P0020 at 2986w5d being seen today for ongoing prenatal care.  She is currently monitored for the following issues for this high-risk pregnancy and has Supervision of other normal pregnancy, antepartum; Low vitamin D level; UTI (urinary tract infection) during pregnancy, second trimester; Trichomonal vaginitis during pregnancy in second trimester; and Mild tetrahydrocannabinol (THC) abuse on her problem list.  Patient reports no complaints.  Contractions: Irregular. Vag. Bleeding: None.  Movement: Present. Denies leaking of fluid.   The following portions of the patient's history were reviewed and updated as appropriate: allergies, current medications, past family history, past medical history, past social history, past surgical history and problem list. Problem list updated.  Objective:   Vitals:   02/22/17 1338  BP: (!) 138/100  Pulse: (!) 106  Weight: 174 lb (78.9 kg)    Fetal Status: Fetal Heart Rate (bpm): 140 Fundal Height: 35 cm Movement: Present  Presentation: Vertex  General:  Alert, oriented and cooperative. Patient is in no acute distress.  Skin: Skin is warm and dry. No rash noted.   Cardiovascular: Normal heart rate noted  Respiratory: Normal respiratory effort, no problems with respiration noted  Abdomen: Soft, gravid, appropriate for gestational age.  Pain/Pressure: Present     Pelvic: Cervical exam performed Dilation: Closed Effacement (%): Thick Station: -3  Extremities: Normal range of motion.     Mental Status:  Normal mood and affect. Normal behavior. Normal judgment and thought content.   Assessment and Plan:  Pregnancy: G3P0020 at 4586w5d  1. Supervision of high risk pregnancy, antepartum     Changed to high risk today d/t CHTN.  New onset of elevated blood pressures in pregnancy starting before initial ob visit of 139/95, 2 previous elevated blood pressures this pregnancy in the mild range:  01/16/17: 131/90, and 02/08/17 of 140/80.  Mostly normotensive until today with 138/100.  Discussed POC with Dr. Jolayne Pantheronstant.  To start antenatal testing given previous documented elevated blood pressures before 20 weeks treat like CHTN.  If blood pressure elevated on Friday start Labetalol 200 mg BID.  Start antenatal testing 02/24/17.  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  No further f/u recommended by MFM.  PIH lab work-up started.    - Strep Gp B NAA - Cervicovaginal ancillary only - Lactate dehydrogenase - Creatinine, serum - CBC - ALT - AST - Pathologist smear review - Protein / creatinine ratio, urine  2. Low vitamin D level     Taking vitamin D weekly.   Preterm labor symptoms and general obstetric precautions including but not limited to vaginal bleeding, contractions, leaking of fluid and fetal movement were reviewed in detail with the patient. Please refer to After Visit Summary for other counseling recommendations.  Return in about 1 week (around 03/01/2017) for ROB, NST, Bi-weekly Antenatal testing.   Roe Coombsachelle A Denney, CNM

## 2017-02-24 ENCOUNTER — Ambulatory Visit: Payer: Medicaid Other | Admitting: Certified Nurse Midwife

## 2017-02-24 ENCOUNTER — Other Ambulatory Visit: Payer: Self-pay | Admitting: Certified Nurse Midwife

## 2017-02-24 ENCOUNTER — Ambulatory Visit (INDEPENDENT_AMBULATORY_CARE_PROVIDER_SITE_OTHER): Payer: Medicaid Other | Admitting: Certified Nurse Midwife

## 2017-02-24 VITALS — BP 142/97 | HR 97

## 2017-02-24 DIAGNOSIS — O0990 Supervision of high risk pregnancy, unspecified, unspecified trimester: Secondary | ICD-10-CM

## 2017-02-24 DIAGNOSIS — O10919 Unspecified pre-existing hypertension complicating pregnancy, unspecified trimester: Secondary | ICD-10-CM | POA: Diagnosis not present

## 2017-02-24 LAB — CERVICOVAGINAL ANCILLARY ONLY
Bacterial vaginitis: NEGATIVE
Candida vaginitis: NEGATIVE
Chlamydia: NEGATIVE
Neisseria Gonorrhea: NEGATIVE
Trichomonas: NEGATIVE

## 2017-02-24 LAB — STREP GP B NAA: Strep Gp B NAA: NEGATIVE

## 2017-02-24 NOTE — Progress Notes (Signed)
Pt here for Nurse Visit NST. DX Chronic gestational hypertension. NST Reactive. BP check 142/97 pulse 97. Pt to do twice weekly NST. BP reviewed with Rachelle.

## 2017-02-24 NOTE — Progress Notes (Signed)
Pt here for a Nurse Visit NST. Dx Chronic Hypertension During Pregnancy

## 2017-02-25 LAB — PROTEIN, URINE, 24 HOUR
PROTEIN 24H UR: 198 mg/(24.h) — AB (ref 30–150)
Protein, Ur: 10.4 mg/dL

## 2017-02-25 LAB — CREATININE CLEARANCE, URINE, 24 HOUR
CREATININE, UR: 67.7 mg/dL
CREATININE: 0.53 mg/dL — AB (ref 0.57–1.00)
Creatinine Clearance: 169 mL/min — ABNORMAL HIGH (ref 88–128)
Creatinine, 24H Ur: 1286 mg/24 hr (ref 800–1800)
GFR calc non Af Amer: 132 mL/min/{1.73_m2} (ref >59)
GFR, EST AFRICAN AMERICAN: 153 mL/min/{1.73_m2} (ref >59)

## 2017-02-28 ENCOUNTER — Ambulatory Visit: Payer: Medicaid Other

## 2017-02-28 ENCOUNTER — Other Ambulatory Visit: Payer: Self-pay | Admitting: Certified Nurse Midwife

## 2017-02-28 ENCOUNTER — Ambulatory Visit (INDEPENDENT_AMBULATORY_CARE_PROVIDER_SITE_OTHER): Payer: Medicaid Other | Admitting: Certified Nurse Midwife

## 2017-02-28 VITALS — BP 136/94 | HR 118 | Wt 177.0 lb

## 2017-02-28 DIAGNOSIS — Z23 Encounter for immunization: Secondary | ICD-10-CM | POA: Diagnosis not present

## 2017-02-28 DIAGNOSIS — O10913 Unspecified pre-existing hypertension complicating pregnancy, third trimester: Secondary | ICD-10-CM | POA: Diagnosis not present

## 2017-02-28 DIAGNOSIS — O10919 Unspecified pre-existing hypertension complicating pregnancy, unspecified trimester: Secondary | ICD-10-CM

## 2017-02-28 DIAGNOSIS — O0990 Supervision of high risk pregnancy, unspecified, unspecified trimester: Secondary | ICD-10-CM

## 2017-02-28 DIAGNOSIS — O0993 Supervision of high risk pregnancy, unspecified, third trimester: Secondary | ICD-10-CM

## 2017-02-28 NOTE — Progress Notes (Signed)
Pt presents for ROB, AFI, and NST c/o dizzy spells x 2-3 days.

## 2017-03-01 NOTE — Progress Notes (Signed)
   PRENATAL VISIT NOTE  Subjective:  Kelli HopeShanice M Bye is a 26 y.o. G3P0020 at 1371w4d being seen today for ongoing prenatal care.  She is currently monitored for the following issues for this high-risk pregnancy and has Supervision of high risk pregnancy, antepartum; Low vitamin D level; UTI (urinary tract infection) during pregnancy, second trimester; Trichomonal vaginitis during pregnancy in second trimester; Mild tetrahydrocannabinol (THC) abuse; and Chronic hypertension during pregnancy, antepartum on her problem list.  Patient reports no bleeding, no contractions, no cramping, no leaking and occasional dizziness, discussed increased water intake, is not drinking a lot of water.  Contractions: Irregular. Vag. Bleeding: None.  Movement: Present. Denies leaking of fluid.   The following portions of the patient's history were reviewed and updated as appropriate: allergies, current medications, past family history, past medical history, past social history, past surgical history and problem list. Problem list updated.  Objective:   Vitals:   02/28/17 1556  BP: (!) 136/94  Pulse: (!) 118  Weight: 177 lb (80.3 kg)    Fetal Status: Fetal Heart Rate (bpm): NST/AFI   Movement: Present  Presentation: Vertex  General:  Alert, oriented and cooperative. Patient is in no acute distress.  Skin: Skin is warm and dry. No rash noted.   Cardiovascular: Normal heart rate noted  Respiratory: Normal respiratory effort, no problems with respiration noted  Abdomen: Soft, gravid, appropriate for gestational age.  Pain/Pressure: Present     Pelvic: Cervical exam deferred        Extremities: Normal range of motion.  Edema: Trace  Mental Status:  Normal mood and affect. Normal behavior. Normal judgment and thought content.  NST: + accels, no decels, moderate variability, Cat. 1 tracing. No contractions on toco.  AFI: WNL, low side of normal  Assessment and Plan:  Pregnancy: G3P0020 at 8871w4d  1. Supervision  of high risk pregnancy, antepartum     Reactive NST, AFI normal - Fetal nonstress test; Future - US OB Limited; Future  2. Chronic hypertension during pregnancy, antepartum      - Fetal nonstress test; Future - US OB Limited; Future  Preterm labor symptoms and general obstetric precautions including but not limited to vaginal bleeding, contractions, leaking of fluid and fetal movement were reviewed in detail with the patient. Please refer to After Visit Summary for other counseling recommendations.  Return in about 1 week (around 03/07/2017) [facility].   Roe Coombsachelle A Kaylon Laroche, CNM

## 2017-03-02 ENCOUNTER — Encounter: Payer: Medicaid Other | Admitting: Certified Nurse Midwife

## 2017-03-03 ENCOUNTER — Ambulatory Visit (INDEPENDENT_AMBULATORY_CARE_PROVIDER_SITE_OTHER): Payer: Medicaid Other | Admitting: Certified Nurse Midwife

## 2017-03-03 ENCOUNTER — Encounter: Payer: Self-pay | Admitting: Certified Nurse Midwife

## 2017-03-03 VITALS — BP 141/100 | HR 112 | Wt 180.0 lb

## 2017-03-03 DIAGNOSIS — O0993 Supervision of high risk pregnancy, unspecified, third trimester: Secondary | ICD-10-CM

## 2017-03-03 DIAGNOSIS — O0990 Supervision of high risk pregnancy, unspecified, unspecified trimester: Secondary | ICD-10-CM

## 2017-03-03 DIAGNOSIS — O10913 Unspecified pre-existing hypertension complicating pregnancy, third trimester: Secondary | ICD-10-CM

## 2017-03-03 DIAGNOSIS — O10919 Unspecified pre-existing hypertension complicating pregnancy, unspecified trimester: Secondary | ICD-10-CM

## 2017-03-03 MED ORDER — LABETALOL HCL 200 MG PO TABS: 200.0000 mg | ORAL_TABLET | Freq: Two times a day (BID) | ORAL | 3 refills | Status: DC

## 2017-03-03 NOTE — Progress Notes (Signed)
Patient presents for NST. 

## 2017-03-03 NOTE — Addendum Note (Signed)
Addended by: Natale MilchSTALLING, Kellyjo Edgren D on: 03/03/2017 11:46 AM   Modules accepted: Orders

## 2017-03-03 NOTE — Progress Notes (Deleted)
Can you please close this chart, it states that I do not have the security to close

## 2017-03-03 NOTE — Progress Notes (Signed)
Consulted with Dr. Gladis RiffleAnywanu about blood pressures.  Started on Labetalol.  Labs reviewed.  CMET/CBC today.  [facility] until 39 weeks unless severe range uncontrolled pressures or symptomatic Pre-E.    NST: + accels, no decels, moderate variability, Cat. 1 tracing. No contractions on toco.   P:  Continue antenatal testing.

## 2017-03-04 LAB — COMPREHENSIVE METABOLIC PANEL
ALK PHOS: 96 IU/L (ref 39–117)
ALT: 6 IU/L (ref 0–32)
AST: 9 IU/L (ref 0–40)
Albumin/Globulin Ratio: 1.1 — ABNORMAL LOW (ref 1.2–2.2)
Albumin: 3.2 g/dL — ABNORMAL LOW (ref 3.5–5.5)
BUN/Creatinine Ratio: 13 (ref 9–23)
BUN: 9 mg/dL (ref 6–20)
Bilirubin Total: <0.2 mg/dL (ref 0.0–1.2)
CHLORIDE: 103 mmol/L (ref 96–106)
CO2: 20 mmol/L (ref 20–29)
CREATININE: 0.67 mg/dL (ref 0.57–1.00)
Calcium: 9 mg/dL (ref 8.7–10.2)
GFR calc Af Amer: 141 mL/min/{1.73_m2} (ref >59)
GFR calc non Af Amer: 123 mL/min/{1.73_m2} (ref >59)
GLOBULIN, TOTAL: 2.8 g/dL (ref 1.5–4.5)
GLUCOSE: 109 mg/dL — AB (ref 65–99)
POTASSIUM: 4.1 mmol/L (ref 3.5–5.2)
Sodium: 138 mmol/L (ref 134–144)
Total Protein: 6 g/dL (ref 6.0–8.5)

## 2017-03-04 LAB — CBC
Hematocrit: 37.8 % (ref 34.0–46.6)
Hemoglobin: 12.2 g/dL (ref 11.1–15.9)
MCH: 28.8 pg (ref 26.6–33.0)
MCHC: 32.3 g/dL (ref 31.5–35.7)
MCV: 89 fL (ref 79–97)
PLATELETS: 246 10*3/uL (ref 150–379)
RBC: 4.23 x10E6/uL (ref 3.77–5.28)
RDW: 13.9 % (ref 12.3–15.4)
WBC: 7.6 10*3/uL (ref 3.4–10.8)

## 2017-03-06 ENCOUNTER — Telehealth (HOSPITAL_COMMUNITY): Payer: Self-pay | Admitting: *Deleted

## 2017-03-06 ENCOUNTER — Encounter (HOSPITAL_COMMUNITY): Payer: Self-pay | Admitting: *Deleted

## 2017-03-06 NOTE — Telephone Encounter (Signed)
Preadmission screen  

## 2017-03-07 ENCOUNTER — Other Ambulatory Visit: Payer: Medicaid Other

## 2017-03-07 ENCOUNTER — Encounter: Payer: Self-pay | Admitting: *Deleted

## 2017-03-07 ENCOUNTER — Ambulatory Visit (INDEPENDENT_AMBULATORY_CARE_PROVIDER_SITE_OTHER): Payer: Medicaid Other | Admitting: Certified Nurse Midwife

## 2017-03-07 VITALS — BP 132/88 | HR 114 | Wt 179.2 lb

## 2017-03-07 DIAGNOSIS — O10913 Unspecified pre-existing hypertension complicating pregnancy, third trimester: Secondary | ICD-10-CM | POA: Diagnosis not present

## 2017-03-07 DIAGNOSIS — O10919 Unspecified pre-existing hypertension complicating pregnancy, unspecified trimester: Secondary | ICD-10-CM

## 2017-03-07 DIAGNOSIS — O0990 Supervision of high risk pregnancy, unspecified, unspecified trimester: Secondary | ICD-10-CM

## 2017-03-07 DIAGNOSIS — O0993 Supervision of high risk pregnancy, unspecified, third trimester: Secondary | ICD-10-CM

## 2017-03-07 NOTE — Progress Notes (Signed)
Patient wants to go ahead and start leave- she is doing better with her pressures.

## 2017-03-07 NOTE — Progress Notes (Addendum)
   PRENATAL VISIT NOTE  Subjective:  Sarah Collier is a 26 y.o. G3P0020 at 3332w3d being seen today for ongoing prenatal care.  She is currently monitored for the following issues for this high-risk pregnancy and has Supervision of high risk pregnancy, antepartum; Low vitamin D level; UTI (urinary tract infection) during pregnancy, second trimester; Trichomonal vaginitis during pregnancy in second trimester; Mild tetrahydrocannabinol (THC) abuse; and Chronic hypertension during pregnancy, antepartum on her problem list.  Patient reports no complaints.  Contractions: Irregular. Vag. Bleeding: None.  Movement: Present. Denies leaking of fluid.   The following portions of the patient's history were reviewed and updated as appropriate: allergies, current medications, past family history, past medical history, past social history, past surgical history and problem list. Problem list updated.  Objective:   Vitals:   03/07/17 1419  BP: 132/88  Pulse: (!) 114  Weight: 179 lb 3.2 oz (81.3 kg)    Fetal Status:     Movement: Present     General:  Alert, oriented and cooperative. Patient is in no acute distress.  Skin: Skin is warm and dry. No rash noted.   Cardiovascular: Normal heart rate noted  Respiratory: Normal respiratory effort, no problems with respiration noted  Abdomen: Soft, gravid, appropriate for gestational age.  Pain/Pressure: Present     Pelvic: Cervical exam deferred        Extremities: Normal range of motion.  Edema: None  Mental Status:  Normal mood and affect. Normal behavior. Normal judgment and thought content.  NST: + accels, no decels, moderate variability, Cat. 1 tracing. No contractions on toco.   Assessment and Plan:  Pregnancy: G3P0020 at 6332w3d  1. Supervision of high risk pregnancy, antepartum     Reactive NST. AFI WNL.  IOL scheduled for 39 weeks.      Lab results reviewed with Dr. Jolayne Pantheronstant.       Patient is to be out of work d/t increased symptoms with this  pregnancy at this time.  Letter  completed.   2. Chronic hypertension during pregnancy, antepartum      - US OB Limited; Future - Fetal nonstress test  Term labor symptoms and general obstetric precautions including but not limited to vaginal bleeding, contractions, leaking of fluid and fetal movement were reviewed in detail with the patient. Please refer to After Visit Summary for other counseling recommendations.  No Follow-up on file.   Roe Coombsachelle A Kadesha Virrueta, CNM

## 2017-03-10 ENCOUNTER — Ambulatory Visit (INDEPENDENT_AMBULATORY_CARE_PROVIDER_SITE_OTHER): Payer: Medicaid Other

## 2017-03-10 VITALS — BP 130/81 | HR 101 | Wt 177.0 lb

## 2017-03-10 DIAGNOSIS — O10913 Unspecified pre-existing hypertension complicating pregnancy, third trimester: Secondary | ICD-10-CM | POA: Diagnosis not present

## 2017-03-10 DIAGNOSIS — O10919 Unspecified pre-existing hypertension complicating pregnancy, unspecified trimester: Secondary | ICD-10-CM

## 2017-03-10 NOTE — Progress Notes (Signed)
Nurse visit for NST only dx: CHTN. Reviewed with CNM NST reactive.

## 2017-03-14 ENCOUNTER — Other Ambulatory Visit: Payer: Medicaid Other

## 2017-03-14 ENCOUNTER — Ambulatory Visit (HOSPITAL_COMMUNITY)
Admission: RE | Admit: 2017-03-14 | Discharge: 2017-03-14 | Disposition: A | Discharge status: Home / Self Care | Payer: Medicaid Other | Source: Ambulatory Visit | Attending: Certified Nurse Midwife | Admitting: Certified Nurse Midwife

## 2017-03-14 ENCOUNTER — Ambulatory Visit (INDEPENDENT_AMBULATORY_CARE_PROVIDER_SITE_OTHER): Payer: Medicaid Other | Admitting: Certified Nurse Midwife

## 2017-03-14 ENCOUNTER — Other Ambulatory Visit (HOSPITAL_COMMUNITY): Payer: Self-pay | Admitting: Advanced Practice Midwife

## 2017-03-14 VITALS — BP 123/86 | HR 97 | Wt 176.0 lb

## 2017-03-14 DIAGNOSIS — O288 Other abnormal findings on antenatal screening of mother: Secondary | ICD-10-CM | POA: Diagnosis present

## 2017-03-14 DIAGNOSIS — O365930 Maternal care for other known or suspected poor fetal growth, third trimester, not applicable or unspecified: Secondary | ICD-10-CM | POA: Insufficient documentation

## 2017-03-14 DIAGNOSIS — Z3A38 38 weeks gestation of pregnancy: Secondary | ICD-10-CM | POA: Insufficient documentation

## 2017-03-14 DIAGNOSIS — O10913 Unspecified pre-existing hypertension complicating pregnancy, third trimester: Secondary | ICD-10-CM

## 2017-03-14 DIAGNOSIS — O10919 Unspecified pre-existing hypertension complicating pregnancy, unspecified trimester: Secondary | ICD-10-CM

## 2017-03-14 DIAGNOSIS — O0993 Supervision of high risk pregnancy, unspecified, third trimester: Secondary | ICD-10-CM

## 2017-03-14 DIAGNOSIS — O99323 Drug use complicating pregnancy, third trimester: Secondary | ICD-10-CM | POA: Diagnosis not present

## 2017-03-14 DIAGNOSIS — O133 Gestational [pregnancy-induced] hypertension without significant proteinuria, third trimester: Secondary | ICD-10-CM | POA: Diagnosis not present

## 2017-03-14 DIAGNOSIS — R7989 Other specified abnormal findings of blood chemistry: Secondary | ICD-10-CM

## 2017-03-14 DIAGNOSIS — O0990 Supervision of high risk pregnancy, unspecified, unspecified trimester: Secondary | ICD-10-CM

## 2017-03-14 DIAGNOSIS — E559 Vitamin D deficiency, unspecified: Secondary | ICD-10-CM

## 2017-03-14 NOTE — Progress Notes (Signed)
   PRENATAL VISIT NOTE  Subjective:  Sarah Collier is a 26 y.o. G3P0020 at 5873w3d being seen today for ongoing prenatal care.  She is currently monitored for the following issues for this high-risk pregnancy and has Supervision of high risk pregnancy, antepartum; Low vitamin D level; UTI (urinary tract infection) during pregnancy, second trimester; Trichomonal vaginitis during pregnancy in second trimester; Mild tetrahydrocannabinol (THC) abuse; and Chronic hypertension during pregnancy, antepartum on her problem list.  Patient reports no complaints.  Contractions: Irregular. Vag. Bleeding: None.  Movement: Present. Denies leaking of fluid.   The following portions of the patient's history were reviewed and updated as appropriate: allergies, current medications, past family history, past medical history, past social history, past surgical history and problem list. Problem list updated.  Objective:   Vitals:   03/14/17 0956  BP: 123/86  Pulse: 97  Weight: 176 lb (79.8 kg)    Fetal Status: Fetal Heart Rate (bpm): NST; non reactive   Movement: Present  Presentation: Vertex  General:  Alert, oriented and cooperative. Patient is in no acute distress.  Skin: Skin is warm and dry. No rash noted.   Cardiovascular: Normal heart rate noted  Respiratory: Normal respiratory effort, no problems with respiration noted  Abdomen: Soft, gravid, appropriate for gestational age.  Pain/Pressure: Present     Pelvic: Cervical exam performed Dilation: Closed Effacement (%): Thick Station: Ballotable  Extremities: Normal range of motion.     Mental Status:  Normal mood and affect. Normal behavior. Normal judgment and thought content.  NST: no accels, no decels, moderate variability, Cat. 2 tracing. No contractions on toco.   Assessment and Plan:  Pregnancy: G3P0020 at 4873w3d  1. Non-reactive NST (non-stress test)     - US MFM FETAL BPP WO NON STRESS; Future  2. Chronic hypertension during pregnancy,  antepartum     Normotensive with labetalol 200 mg BID  3. Low vitamin D level      Taking weekly vitamin D  4. Supervision of high risk pregnancy, antepartum     D/T CHTN, has IOL scheduled for 39 weeks: Saturday 03/18/17.    Term labor symptoms and general obstetric precautions including but not limited to vaginal bleeding, contractions, leaking of fluid and fetal movement were reviewed in detail with the patient. Please refer to After Visit Summary for other counseling recommendations.  Return for continue Bi-weekly Antenatal testing, BPP today.  Has IOL scheduled for 03/18/17 .   Roe Coombsachelle A Zira Helinski, CNM

## 2017-03-18 ENCOUNTER — Inpatient Hospital Stay (HOSPITAL_COMMUNITY)
Admission: RE | Admit: 2017-03-18 | Discharge: 2017-03-21 | DRG: 765 | Disposition: A | Discharge status: Home / Self Care | Payer: Medicaid Other | Source: Ambulatory Visit | Attending: Obstetrics and Gynecology | Admitting: Obstetrics and Gynecology

## 2017-03-18 ENCOUNTER — Encounter (HOSPITAL_COMMUNITY): Payer: Self-pay

## 2017-03-18 DIAGNOSIS — Z87891 Personal history of nicotine dependence: Secondary | ICD-10-CM | POA: Diagnosis not present

## 2017-03-18 DIAGNOSIS — Z3A39 39 weeks gestation of pregnancy: Secondary | ICD-10-CM

## 2017-03-18 DIAGNOSIS — O99214 Obesity complicating childbirth: Secondary | ICD-10-CM | POA: Diagnosis present

## 2017-03-18 DIAGNOSIS — O365930 Maternal care for other known or suspected poor fetal growth, third trimester, not applicable or unspecified: Secondary | ICD-10-CM | POA: Diagnosis present

## 2017-03-18 DIAGNOSIS — F1210 Cannabis abuse, uncomplicated: Secondary | ICD-10-CM | POA: Diagnosis present

## 2017-03-18 DIAGNOSIS — O1092 Unspecified pre-existing hypertension complicating childbirth: Secondary | ICD-10-CM | POA: Diagnosis not present

## 2017-03-18 DIAGNOSIS — O1002 Pre-existing essential hypertension complicating childbirth: Principal | ICD-10-CM | POA: Diagnosis present

## 2017-03-18 DIAGNOSIS — O99324 Drug use complicating childbirth: Secondary | ICD-10-CM | POA: Diagnosis present

## 2017-03-18 DIAGNOSIS — Z6835 Body mass index (BMI) 35.0-35.9, adult: Secondary | ICD-10-CM

## 2017-03-18 DIAGNOSIS — O10919 Unspecified pre-existing hypertension complicating pregnancy, unspecified trimester: Secondary | ICD-10-CM | POA: Diagnosis present

## 2017-03-18 LAB — CBC
HEMATOCRIT: 37.6 % (ref 36.0–46.0)
Hemoglobin: 12.6 g/dL (ref 12.0–15.0)
MCH: 30 pg (ref 26.0–34.0)
MCHC: 33.5 g/dL (ref 30.0–36.0)
MCV: 89.5 fL (ref 78.0–100.0)
PLATELETS: 274 10*3/uL (ref 150–400)
RBC: 4.2 MIL/uL (ref 3.87–5.11)
RDW: 14.4 % (ref 11.5–15.5)
WBC: 8.7 10*3/uL (ref 4.0–10.5)

## 2017-03-18 LAB — TYPE AND SCREEN
ABO/RH(D): AB POS
Antibody Screen: NEGATIVE

## 2017-03-18 LAB — PROTEIN / CREATININE RATIO, URINE: CREATININE, URINE: 34 mg/dL

## 2017-03-18 LAB — COMPREHENSIVE METABOLIC PANEL
ALK PHOS: 103 U/L (ref 38–126)
ALT: 13 U/L — AB (ref 14–54)
AST: 22 U/L (ref 15–41)
Albumin: 3.1 g/dL — ABNORMAL LOW (ref 3.5–5.0)
Anion gap: 10 (ref 5–15)
BILIRUBIN TOTAL: 0.3 mg/dL (ref 0.3–1.2)
BUN: 11 mg/dL (ref 6–20)
CALCIUM: 9.2 mg/dL (ref 8.9–10.3)
CHLORIDE: 103 mmol/L (ref 101–111)
CO2: 22 mmol/L (ref 22–32)
CREATININE: 0.7 mg/dL (ref 0.44–1.00)
Glucose, Bld: 91 mg/dL (ref 65–99)
Potassium: 4.2 mmol/L (ref 3.5–5.1)
Sodium: 135 mmol/L (ref 135–145)
TOTAL PROTEIN: 7.1 g/dL (ref 6.5–8.1)

## 2017-03-18 LAB — ABO/RH: ABO/RH(D): AB POS

## 2017-03-18 LAB — RPR: RPR Ser Ql: NONREACTIVE

## 2017-03-18 MED ORDER — OXYCODONE-ACETAMINOPHEN 5-325 MG PO TABS: 1.0000 | ORAL_TABLET | ORAL | Status: DC | PRN

## 2017-03-18 MED ORDER — LACTATED RINGERS IV SOLN
INTRAVENOUS | Status: DC
  Administered 2017-03-18 – 2017-03-19 (×5): via INTRAVENOUS

## 2017-03-18 MED ORDER — MISOPROSTOL 50MCG HALF TABLET
50.0000 ug | ORAL_TABLET | Freq: Once | ORAL | Status: AC
  Administered 2017-03-18: 50 ug via ORAL
  Filled 2017-03-18: qty 1

## 2017-03-18 MED ORDER — HYDRALAZINE HCL 20 MG/ML IJ SOLN: 10.0000 mg | Freq: Once | INTRAMUSCULAR | Status: DC | PRN

## 2017-03-18 MED ORDER — LIDOCAINE HCL (PF) 1 % IJ SOLN: 30.0000 mL | INTRAMUSCULAR | Status: DC | PRN

## 2017-03-18 MED ORDER — SOD CITRATE-CITRIC ACID 500-334 MG/5ML PO SOLN
30.0000 mL | ORAL | Status: DC | PRN
  Administered 2017-03-19: 30 mL via ORAL
  Filled 2017-03-18: qty 15

## 2017-03-18 MED ORDER — OXYCODONE-ACETAMINOPHEN 5-325 MG PO TABS: 2.0000 | ORAL_TABLET | ORAL | Status: DC | PRN

## 2017-03-18 MED ORDER — MISOPROSTOL 25 MCG QUARTER TABLET
25.0000 ug | ORAL_TABLET | ORAL | Status: DC | PRN
  Administered 2017-03-18 (×2): 25 ug via VAGINAL
  Filled 2017-03-18 (×2): qty 1

## 2017-03-18 MED ORDER — LABETALOL HCL 200 MG PO TABS
200.0000 mg | ORAL_TABLET | Freq: Two times a day (BID) | ORAL | Status: DC
  Administered 2017-03-18 – 2017-03-19 (×3): 200 mg via ORAL
  Filled 2017-03-18 (×3): qty 1

## 2017-03-18 MED ORDER — LACTATED RINGERS IV SOLN
500.0000 mL | INTRAVENOUS | Status: DC | PRN
  Administered 2017-03-19 (×2): 500 mL via INTRAVENOUS

## 2017-03-18 MED ORDER — LABETALOL HCL 5 MG/ML IV SOLN: 20.0000 mg | INTRAVENOUS | Status: DC | PRN

## 2017-03-18 MED ORDER — FLEET ENEMA 7-19 GM/118ML RE ENEM: 1.0000 | ENEMA | RECTAL | Status: DC | PRN

## 2017-03-18 MED ORDER — FENTANYL CITRATE (PF) 100 MCG/2ML IJ SOLN
50.0000 ug | INTRAMUSCULAR | Status: DC | PRN
  Administered 2017-03-18 – 2017-03-19 (×4): 100 ug via INTRAVENOUS
  Filled 2017-03-18 (×4): qty 2

## 2017-03-18 MED ORDER — TERBUTALINE SULFATE 1 MG/ML IJ SOLN
0.2500 mg | Freq: Once | INTRAMUSCULAR | Status: AC | PRN
  Administered 2017-03-19: 0.25 mg via SUBCUTANEOUS
  Filled 2017-03-18: qty 1

## 2017-03-18 MED ORDER — OXYTOCIN BOLUS FROM INFUSION: 500.0000 mL | Freq: Once | INTRAVENOUS | Status: DC

## 2017-03-18 MED ORDER — ONDANSETRON HCL 4 MG/2ML IJ SOLN: 4.0000 mg | Freq: Four times a day (QID) | INTRAMUSCULAR | Status: DC | PRN

## 2017-03-18 MED ORDER — OXYTOCIN 40 UNITS IN LACTATED RINGERS INFUSION - SIMPLE MED: 2.5000 [IU]/h | INTRAVENOUS | Status: DC

## 2017-03-18 MED ORDER — ACETAMINOPHEN 325 MG PO TABS: 650.0000 mg | ORAL_TABLET | ORAL | Status: DC | PRN

## 2017-03-18 NOTE — Progress Notes (Addendum)
Attempt to place foley bulb unsuccessful.  Will place another cytotec.  Pt not ready for that placement.  Will let this RN know when she's ready.

## 2017-03-18 NOTE — Progress Notes (Signed)
Labor Progress Note  S: Patient seen & examined for progress of labor. Patient is feeling her contractions and reports lower pelvic pressure, but she wants to hold off on an epidural as long as possible.   O: BP 114/64   Pulse 77   Temp 98.2 F (36.8 C) (Oral)   Resp 16   Ht 4\' 11"  (1.499 m)   Wt 80.3 kg (177 lb)   LMP 06/18/2016   BMI 35.75 kg/m   FHT: 120bpm, mod var, +accels, no decels TOCO: q18min, patient looks comfortable during contractions.  CVE: Dilation: 1 Effacement (%): 40 Station: -3 Presentation: Vertex Exam by:: A Showfety RN  A&P: 26 y.o. [id number] [redacted]w[redacted]d here for IOL for chronic HTN.  Blood pressure elevated to 149/111 at 1030 this am, but has been normotensive since then. S/p 25 mcg cytotec.  May need placement of foley bulb after cervical exam at around 1300. Continue induction. Anticipate SVD  Lezlie Octave, MD FM Resident PGY-1 03/18/2017 12:33 PM

## 2017-03-18 NOTE — Progress Notes (Signed)
Labor Progress Note  S: Patient seen & examined for progress of labor. Patient comfortable, no epidural placed yet.  Patient feeling widely spaced, irregular contractions.  O: BP (!) 136/100   Pulse 83   Temp 98.2 F (36.8 C) (Oral)   Resp 16   Ht 4\' 11"  (1.499 m)   Wt 80.3 kg (177 lb)   LMP 06/18/2016   BMI 35.75 kg/m   FHT: 120bpm, mod var, +accels, no decels TOCO: q6-28min, patient looks comfortable during contractions  CVE: Dilation: Fingertip Effacement (%): 50 Station: -3 Presentation: Vertex Exam by:: Dr Alton Revere  A&P: 26 y.o. [id number] [redacted]w[redacted]d here for IOL due to chronic HTN.  S/p 25 mcg cytotec. Attempted to place foley bulb but was unsuccessful.  Will give second dose of 25 mcg cytotec to continue cervical ripening. Continue induction. Anticipate SVD.  Lezlie Octave, MD FM Resident PGY-1 03/18/2017 2:36 PM

## 2017-03-18 NOTE — Progress Notes (Addendum)
   Sarah Collier is a 26 y.o. G3P0020 at 109w0d  admitted for induction of labor due to [facility] on medication.  Subjective: Patient resting in bed, no complaints at this time. Has had one dose of fentanyl IV.   Objective: Vitals:   03/18/17 2200 03/18/17 2230 03/18/17 2300 03/18/17 2330  BP: 123/75 122/73 119/71 113/62  Pulse: 87 79 86 83  Resp: 16 16 18    Temp:      TempSrc:      Weight:      Height:       No intake/output data recorded.  FHT:  FHR: 120 bpm, variability: moderate,  accelerations:  Present,  decelerations:  Absent UC:   Patient having runs of uterine hyperstimulation and then periods of no contractions.  SVE:   Dilation: 2 Effacement (%): Thick Station: -3 Exam by:: S Nix RN Cytotec x 3 now Labs: Lab Results  Component Value Date   WBC 8.7 03/18/2017   HGB 12.6 03/18/2017   HCT 37.6 03/18/2017   MCV 89.5 03/18/2017   PLT 274 03/18/2017    Assessment / Plan: IOL, progressing slowly. Will hold next dose of cytotec and consider pitocin at 130 if [facility].  Vital signs now stable after earlier elevated pressures.   Labor: still 2 cm; will consider pitocin if heart rate pattern is reassuring Fetal Wellbeing:  Category I Pain Control:  IV pain meds Anticipated MOD:  NSVD  Marylene Land CNM 03/18/2017, 11:45 PM

## 2017-03-18 NOTE — Progress Notes (Signed)
Labor Progress Note  S: Patient seen & examined for progress of labor. Patient comfortable, no epidural placed yet.  Patient feeling more frequent contractions.   O: BP (!) 136/100   Pulse 83   Temp 98.2 F (36.8 C) (Oral)   Resp 16   Ht 4\' 11"  (1.499 m)   Wt 80.3 kg (177 lb)   LMP 06/18/2016   BMI 35.75 kg/m   FHT: 120bpm, mod var, +accels, no decels TOCO: q6-45min, patient looks comfortable during contractions  CVE: Dilation: Fingertip Effacement (%): 50 Station: -3 Presentation: Vertex Exam by:: Dr Alton Revere  A&P: 26 y.o. [id number] [redacted]w[redacted]d here for IOL due to chronic HTN.  Last BP elevated at 136/100, other pressures wnl.  Will continue to monitor closely. S/p 25 mcg cytotec x 2. Continue induction. Anticipate SVD.  Lezlie Octave, MD FM Resident PGY-1 03/18/2017 6:27 PM

## 2017-03-18 NOTE — Progress Notes (Signed)
  LABOR PROGRESS NOTE  Sarah Collier is a 26 y.o. G3P0020 at [redacted]w[redacted]d  admitted for IOL for chronic HTN  Subjective: She is doing well, denies blurred vision, headaches, RUQ pain.   Objective: BP (!) 141/89   Pulse 90   Temp 99 F (37.2 C) (Axillary)   Resp 16   Ht 4\' 11"  (1.499 m)   Wt 80.3 kg (177 lb)   LMP 06/18/2016   BMI 35.75 kg/m  or  Vitals:   03/18/17 2030 03/18/17 2036 03/18/17 2045 03/18/17 2100  BP: (!) 136/111 (!) 151/114 (!) 139/93 (!) 141/89  Pulse: (!) 116 (!) 104 88 90  Resp:    16  Temp:      TempSrc:      Weight:      Height:         Dilation: 2 Effacement (%): Thick Cervical Position: Posterior Station: -3 Presentation: Vertex Exam by:: S Nix RN FHT: baseline 125, moderate variability, accels present no decels Uterine activity: mild contractions irregularly  Labs: Lab Results  Component Value Date   WBC 8.7 03/18/2017   HGB 12.6 03/18/2017   HCT 37.6 03/18/2017   MCV 89.5 03/18/2017   PLT 274 03/18/2017    Patient Active Problem List   Diagnosis Date Noted  . Chronic hypertension during pregnancy, antepartum 02/23/2017  . Mild tetrahydrocannabinol (THC) abuse 09/26/2016  . Low vitamin D level 09/20/2016  . UTI (urinary tract infection) during pregnancy, second trimester 09/20/2016  . Trichomonal vaginitis during pregnancy in second trimester 09/20/2016  . Supervision of high risk pregnancy, antepartum 09/14/2016    Assessment / Plan: 26 y.o. G3P0020 at [redacted]w[redacted]d here for IOL  Labor: s/p cytotec vaginally x 2, will try oral cytotec Fetal Wellbeing:  Cat 1  Pain Control:  Desires epidural Anticipated MOD:  Vaginal  Scheduled 200 mg labetalol BID, patient's home medication.  PRN labetalol available for elevated pressures  Tillman Sers, DO PGY-2 8/18/20189:40 PM

## 2017-03-18 NOTE — Anesthesia Pain Management Evaluation Note (Signed)
  CRNA Pain Management Visit Note  Patient: Sarah Collier, 26 y.o., female  "Hello I am a member of the anesthesia team at [facility]. We have an anesthesia team available at all times to provide care throughout the hospital, including epidural management and anesthesia for C-section. I don't know your plan for the delivery whether it a natural birth, water birth, IV sedation, nitrous supplementation, doula or epidural, but we want to meet your pain goals."   1.Was your pain managed to your expectations on prior hospitalizations?   No prior hospitalizations  2.What is your expectation for pain management during this hospitalization?     Epidural  3.How can we help you reach that goal?   Record the patient's initial score and the patient's pain goal.   Pain: 0  Pain Goal: 6 [facility] wants you to be able to say your pain was always managed very well.  Laban Emperor 03/18/2017

## 2017-03-18 NOTE — H&P (Signed)
LABOR AND DELIVERY ADMISSION HISTORY AND PHYSICAL NOTE  Sarah Collier is a 26 y.o. female G3P0020 with IUP at [redacted]w[redacted]d by LMP presenting for IOL for chronic HTN.   She endorses some contractions day before induction, none currently.  She reports positive fetal movement. She denies leakage of fluid or vaginal bleeding.  She denies blurred vision, headache, RUQ pain.  Prenatal History/Complications: Clinic [facility] Prenatal Labs  Dating  LMP Blood type: AB/Positive/-- (02/14 1558)   Genetic Screen  AFP:  neg  NIPS:Mat21:neg Antibody:Negative (02/14 1558)  Anatomic [place] Limited anatomy; female fetus; F/U study completed normal Rubella: 3.59 (02/14 1558)  GTT Third trimester: WNL RPR: Non Reactive (06/01 1115)   Flu vaccine     05/2016 HBsAg: Negative (02/14 1558)   TDaP vaccine                                               Rhogam:n/a AB+ HIV:   NR   Baby Food      breast                                         QMG:[id number] (07/25 1450)  Contraception      OCP DTO:IZTIWP 09/2016  Circumcision    Wants to get done   Pediatrician      [facility]   Support Person      Ter[place]     Past Medical History: Past Medical History:  Diagnosis Date  . Infection    gonorrhea, chlamydia  . PID (acute pelvic inflammatory disease)   . Pregnancy induced hypertension     Past Surgical History: Past Surgical History:  Procedure Laterality Date  . THERAPEUTIC ABORTION    . WISDOM TOOTH EXTRACTION      Obstetrical History: OB History    Gravida Para Term Preterm AB Living   3       2     SAB TAB Ectopic Multiple Live Births     2            Social History: Social History   Social History  . Marital status: Single    Spouse name: N/A  . Number of children: N/A  . Years of education: N/A   Social History Main Topics  . Smoking status: Former Smoker    Years: 2.00    Types: Cigars    Quit date: 07/06/2016  . Smokeless tobacco: Never Used  . Alcohol use No     Comment:  Patient reports drinking alcohol every other week, once or twice  . Drug use: No  . Sexual activity: Yes    Birth control/ protection: None   Other Topics Concern  . None   Social History Narrative  . None    Family History: Family History  Problem Relation Age of Onset  . Hypertension Mother   . Asthma Father   . Hypertension Father   . Diabetes Maternal Grandmother   . Hearing loss Neg Hx     Allergies: No Known Allergies  Prescriptions Prior to Admission  Medication Sig Dispense Refill Last Dose  . cephALEXin (KEFLEX) 500 MG capsule   5 Taking  . Elastic Bandages & Supports (COMFORT FIT MATERNITY SUPP LG) MISC 1 Units by Does not  apply route daily. 1 each 0 Taking  . labetalol (NORMODYNE) 200 MG tablet Take 1 tablet (200 mg total) by mouth 2 (two) times daily. 60 tablet 3 Taking  . Prenat-FeAsp-Meth-FA-DHA w/o A (PRENATE PIXIE) 10-0.6-0.4-200 MG CAPS Take 1 tablet by mouth daily. 30 capsule 12 Taking  . Vitamin D, Ergocalciferol, (DRISDOL) 50000 units CAPS capsule Take 1 capsule (50,000 Units total) by mouth every 7 (seven) days. 30 capsule 2 Taking     Review of Systems   All systems reviewed and negative except as stated in HPI  Physical Exam Blood pressure 134/90, pulse 78, temperature 98.2 F (36.8 C), temperature source Oral, resp. rate 18, height 4\' 11"  (1.499 m), weight 177 lb (80.3 kg), last menstrual period 06/18/2016. General appearance: alert, cooperative and no distress Lungs: no respiratory distress Heart: regular rate Abdomen: soft, non-tender Extremities: No calf swelling or tenderness Presentation: cephalic by exam Fetal monitoring: baseline 140, accels present no decels Uterine activity: irritable    Prenatal labs: ABO, Rh: AB/Positive/-- (02/14 1558) Antibody: Negative (02/14 1558) Rubella: immune RPR: Non Reactive (06/01 1115)  HBsAg: Negative (02/14 1558)  HIV:   NR GBS: Negative (07/25 1450)  Genetic screening:  neg Anatomy US:  limited growth, SGA  Prenatal Transfer Tool  Maternal Diabetes: No Genetic Screening: Normal Maternal Ultrasounds/Referrals: Abnormal:  Findings:   Other: limited growth Fetal Ultrasounds or other Referrals:  Referred to Materal Fetal Medicine  Maternal Substance Abuse:  Yes:  Type: Marijuana Significant Maternal Medications:  Meds include: Other:  labetalol Significant Maternal Lab Results: None  Results for orders placed or performed during the hospital encounter of 03/18/17 (from the past 24 hour(s))  CBC   Collection Time: 03/18/17  8:00 AM  Result Value Ref Range   WBC 8.7 4.0 - 10.5 K/uL   RBC 4.20 3.87 - 5.11 MIL/uL   Hemoglobin 12.6 12.0 - 15.0 g/dL   HCT 96.0 45.4 - 09.8 %   MCV 89.5 78.0 - 100.0 fL   MCH 30.0 26.0 - 34.0 pg   MCHC 33.5 30.0 - 36.0 g/dL   RDW 11.9 14.7 - 82.9 %   Platelets 274 150 - 400 K/uL    Patient Active Problem List   Diagnosis Date Noted  . Chronic hypertension during pregnancy, antepartum 02/23/2017  . Mild tetrahydrocannabinol (THC) abuse 09/26/2016  . Low vitamin D level 09/20/2016  . UTI (urinary tract infection) during pregnancy, second trimester 09/20/2016  . Trichomonal vaginitis during pregnancy in second trimester 09/20/2016  . Supervision of high risk pregnancy, antepartum 09/14/2016    Assessment: Sarah Collier is a 26 y.o. G3P0020 at [redacted]w[redacted]d here for IOL for chronic HTN  #Labor: Start cervical ripening with Cytotec.  #Pain: epidural #FWB:  cat 1 tracing #ID:  GBS negative #MOF: bottle #MOC: undecided #Circ:  Yes, outpatient  Tillman Sers, DO PGY-1 8/18/20188:29 AM   OB FELLOW HISTORY AND PHYSICAL ATTESTATION  I have seen and examined this patient; I agree with above documentation in the resident's note.  --Cervical ripening started with cytotec, placed at 9am. --Will place FB with next check if needed  Frederik Pear, MD OB Fellow 03/18/2017, 12:18 PM

## 2017-03-19 ENCOUNTER — Inpatient Hospital Stay (HOSPITAL_COMMUNITY): Payer: Medicaid Other | Admitting: Anesthesiology

## 2017-03-19 ENCOUNTER — Encounter (HOSPITAL_COMMUNITY)
Admission: RE | Disposition: A | Discharge status: Home / Self Care | Payer: Self-pay | Source: Ambulatory Visit | Attending: Obstetrics and Gynecology

## 2017-03-19 ENCOUNTER — Encounter (HOSPITAL_COMMUNITY): Payer: Self-pay

## 2017-03-19 DIAGNOSIS — O1092 Unspecified pre-existing hypertension complicating childbirth: Secondary | ICD-10-CM

## 2017-03-19 DIAGNOSIS — Z3A39 39 weeks gestation of pregnancy: Secondary | ICD-10-CM

## 2017-03-19 LAB — CBC
HEMATOCRIT: 37 % (ref 36.0–46.0)
Hemoglobin: 12.2 g/dL (ref 12.0–15.0)
MCH: 29.4 pg (ref 26.0–34.0)
MCHC: 33 g/dL (ref 30.0–36.0)
MCV: 89.2 fL (ref 78.0–100.0)
Platelets: 249 10*3/uL (ref 150–400)
RBC: 4.15 MIL/uL (ref 3.87–5.11)
RDW: 14.5 % (ref 11.5–15.5)
WBC: 8.1 10*3/uL (ref 4.0–10.5)

## 2017-03-19 SURGERY — Surgical Case
Anesthesia: Epidural

## 2017-03-19 MED ORDER — ACETAMINOPHEN 325 MG PO TABS
650.0000 mg | ORAL_TABLET | ORAL | Status: DC | PRN
  Administered 2017-03-20: 650 mg via ORAL
  Filled 2017-03-19: qty 2

## 2017-03-19 MED ORDER — SODIUM BICARBONATE 8.4 % IV SOLN
INTRAVENOUS | Status: AC
Start: 2017-03-19 — End: 2017-03-19
  Filled 2017-03-19: qty 50

## 2017-03-19 MED ORDER — COCONUT OIL OIL: 1.0000 "application " | TOPICAL_OIL | Status: DC | PRN

## 2017-03-19 MED ORDER — MORPHINE SULFATE (PF) 0.5 MG/ML IJ SOLN
INTRAMUSCULAR | Status: DC | PRN
Start: 2017-03-19 — End: 2017-03-19
  Administered 2017-03-19: 4 mg via EPIDURAL

## 2017-03-19 MED ORDER — LACTATED RINGERS IV SOLN
INTRAVENOUS | Status: DC | PRN
  Administered 2017-03-19: 20:00:00 via INTRAVENOUS

## 2017-03-19 MED ORDER — PHENYLEPHRINE 40 MCG/ML (10ML) SYRINGE FOR IV PUSH (FOR BLOOD PRESSURE SUPPORT)
80.0000 ug | PREFILLED_SYRINGE | INTRAVENOUS | Status: DC | PRN
  Filled 2017-03-19: qty 10

## 2017-03-19 MED ORDER — SODIUM BICARBONATE 8.4 % IV SOLN
INTRAVENOUS | Status: DC | PRN
  Administered 2017-03-19 (×3): 5 mL via EPIDURAL
  Administered 2017-03-19: 6 mL via EPIDURAL
  Administered 2017-03-19: 5 mL via EPIDURAL

## 2017-03-19 MED ORDER — SIMETHICONE 80 MG PO CHEW
80.0000 mg | CHEWABLE_TABLET | ORAL | Status: DC
  Administered 2017-03-19 – 2017-03-20 (×2): 80 mg via ORAL
  Filled 2017-03-19 (×2): qty 1

## 2017-03-19 MED ORDER — OXYTOCIN 40 UNITS IN LACTATED RINGERS INFUSION - SIMPLE MED
1.0000 m[IU]/min | INTRAVENOUS | Status: DC
  Administered 2017-03-19: 2 m[IU]/min via INTRAVENOUS
  Filled 2017-03-19: qty 1000

## 2017-03-19 MED ORDER — MORPHINE SULFATE (PF) 0.5 MG/ML IJ SOLN
INTRAMUSCULAR | Status: AC
  Filled 2017-03-19: qty 10

## 2017-03-19 MED ORDER — ZOLPIDEM TARTRATE 5 MG PO TABS: 5.0000 mg | ORAL_TABLET | Freq: Every evening | ORAL | Status: DC | PRN

## 2017-03-19 MED ORDER — OXYTOCIN 40 UNITS IN LACTATED RINGERS INFUSION - SIMPLE MED: 2.5000 [IU]/h | INTRAVENOUS | Status: AC

## 2017-03-19 MED ORDER — LACTATED RINGERS IV SOLN
INTRAVENOUS | Status: DC
  Administered 2017-03-20: 02:00:00 via INTRAVENOUS

## 2017-03-19 MED ORDER — SIMETHICONE 80 MG PO CHEW: 80.0000 mg | CHEWABLE_TABLET | ORAL | Status: DC | PRN

## 2017-03-19 MED ORDER — IBUPROFEN 600 MG PO TABS
600.0000 mg | ORAL_TABLET | Freq: Four times a day (QID) | ORAL | Status: DC
  Administered 2017-03-19 – 2017-03-21 (×7): 600 mg via ORAL
  Filled 2017-03-19 (×7): qty 1

## 2017-03-19 MED ORDER — DIBUCAINE 1 % RE OINT: 1.0000 "application " | TOPICAL_OINTMENT | RECTAL | Status: DC | PRN

## 2017-03-19 MED ORDER — LACTATED RINGERS IV SOLN
INTRAVENOUS | Status: DC | PRN
  Administered 2017-03-19 (×2): via INTRAVENOUS

## 2017-03-19 MED ORDER — DIPHENHYDRAMINE HCL 25 MG PO CAPS
25.0000 mg | ORAL_CAPSULE | ORAL | Status: DC | PRN
  Administered 2017-03-20 (×3): 25 mg via ORAL
  Filled 2017-03-19 (×4): qty 1

## 2017-03-19 MED ORDER — PHENYLEPHRINE 40 MCG/ML (10ML) SYRINGE FOR IV PUSH (FOR BLOOD PRESSURE SUPPORT): 80.0000 ug | PREFILLED_SYRINGE | INTRAVENOUS | Status: DC | PRN

## 2017-03-19 MED ORDER — ONDANSETRON HCL 4 MG/2ML IJ SOLN
INTRAMUSCULAR | Status: DC | PRN
  Administered 2017-03-19: 4 mg via INTRAVENOUS

## 2017-03-19 MED ORDER — BUPIVACAINE HCL (PF) 0.5 % IJ SOLN
INTRAMUSCULAR | Status: AC
  Filled 2017-03-19: qty 30

## 2017-03-19 MED ORDER — MISOPROSTOL 50MCG HALF TABLET
50.0000 ug | ORAL_TABLET | Freq: Once | ORAL | Status: AC
  Administered 2017-03-19: 50 ug via ORAL
  Filled 2017-03-19: qty 1

## 2017-03-19 MED ORDER — FENTANYL 2.5 MCG/ML BUPIVACAINE 1/10 % EPIDURAL INFUSION (WH - ANES): 14.0000 mL/h | INTRAMUSCULAR | Status: DC | PRN

## 2017-03-19 MED ORDER — FENTANYL 2.5 MCG/ML BUPIVACAINE 1/10 % EPIDURAL INFUSION (WH - ANES)
14.0000 mL/h | INTRAMUSCULAR | Status: DC | PRN
  Administered 2017-03-19 (×2): 14 mL/h via EPIDURAL
  Filled 2017-03-19: qty 100

## 2017-03-19 MED ORDER — PRENATAL MULTIVITAMIN CH
1.0000 | ORAL_TABLET | Freq: Every day | ORAL | Status: DC
  Administered 2017-03-20 – 2017-03-21 (×2): 1 via ORAL
  Filled 2017-03-19 (×2): qty 1

## 2017-03-19 MED ORDER — MEPERIDINE HCL 25 MG/ML IJ SOLN: 6.2500 mg | INTRAMUSCULAR | Status: DC | PRN

## 2017-03-19 MED ORDER — EPHEDRINE 5 MG/ML INJ: 10.0000 mg | INTRAVENOUS | Status: DC | PRN

## 2017-03-19 MED ORDER — LIDOCAINE-EPINEPHRINE (PF) 2 %-1:200000 IJ SOLN
INTRAMUSCULAR | Status: AC
  Filled 2017-03-19: qty 20

## 2017-03-19 MED ORDER — NALBUPHINE HCL 10 MG/ML IJ SOLN: 5.0000 mg | Freq: Once | INTRAMUSCULAR | Status: DC | PRN

## 2017-03-19 MED ORDER — SCOPOLAMINE 1 MG/3DAYS TD PT72
MEDICATED_PATCH | TRANSDERMAL | Status: DC | PRN
  Administered 2017-03-19: 1 via TRANSDERMAL

## 2017-03-19 MED ORDER — SODIUM CHLORIDE 0.9% FLUSH: 3.0000 mL | INTRAVENOUS | Status: DC | PRN

## 2017-03-19 MED ORDER — DIPHENHYDRAMINE HCL 50 MG/ML IJ SOLN: 12.5000 mg | INTRAMUSCULAR | Status: DC | PRN

## 2017-03-19 MED ORDER — DIPHENHYDRAMINE HCL 50 MG/ML IJ SOLN
12.5000 mg | INTRAMUSCULAR | Status: DC | PRN
  Administered 2017-03-19 – 2017-03-20 (×2): 12.5 mg via INTRAVENOUS
  Filled 2017-03-19 (×2): qty 1

## 2017-03-19 MED ORDER — OXYTOCIN 10 UNIT/ML IJ SOLN
INTRAVENOUS | Status: DC | PRN
  Administered 2017-03-19: 40 [IU] via INTRAVENOUS

## 2017-03-19 MED ORDER — OXYCODONE HCL 5 MG PO TABS: 5.0000 mg | ORAL_TABLET | ORAL | Status: DC | PRN

## 2017-03-19 MED ORDER — OXYCODONE HCL 5 MG PO TABS: 10.0000 mg | ORAL_TABLET | ORAL | Status: DC | PRN

## 2017-03-19 MED ORDER — KETOROLAC TROMETHAMINE 30 MG/ML IJ SOLN: 30.0000 mg | Freq: Once | INTRAMUSCULAR | Status: DC | PRN

## 2017-03-19 MED ORDER — MENTHOL 3 MG MT LOZG: 1.0000 | LOZENGE | OROMUCOSAL | Status: DC | PRN

## 2017-03-19 MED ORDER — CEFAZOLIN SODIUM-DEXTROSE 2-3 GM-% IV SOLR
INTRAVENOUS | Status: DC | PRN
  Administered 2017-03-19: 2 g via INTRAVENOUS

## 2017-03-19 MED ORDER — NALBUPHINE HCL 10 MG/ML IJ SOLN: 5.0000 mg | INTRAMUSCULAR | Status: DC | PRN

## 2017-03-19 MED ORDER — WITCH HAZEL-GLYCERIN EX PADS: 1.0000 "application " | MEDICATED_PAD | CUTANEOUS | Status: DC | PRN

## 2017-03-19 MED ORDER — NALOXONE HCL 2 MG/2ML IJ SOSY
1.0000 ug/kg/h | PREFILLED_SYRINGE | INTRAMUSCULAR | Status: DC | PRN
  Filled 2017-03-19: qty 2

## 2017-03-19 MED ORDER — SODIUM CHLORIDE 0.9 % IR SOLN
Status: DC | PRN
  Administered 2017-03-19: 20

## 2017-03-19 MED ORDER — SENNOSIDES-DOCUSATE SODIUM 8.6-50 MG PO TABS
2.0000 | ORAL_TABLET | ORAL | Status: DC
  Administered 2017-03-19 – 2017-03-20 (×2): 2 via ORAL
  Filled 2017-03-19 (×2): qty 2

## 2017-03-19 MED ORDER — LACTATED RINGERS IV SOLN
500.0000 mL | Freq: Once | INTRAVENOUS | Status: AC
  Administered 2017-03-19: 500 mL via INTRAVENOUS

## 2017-03-19 MED ORDER — ONDANSETRON HCL 4 MG/2ML IJ SOLN: 4.0000 mg | Freq: Three times a day (TID) | INTRAMUSCULAR | Status: DC | PRN

## 2017-03-19 MED ORDER — LIDOCAINE HCL (PF) 1 % IJ SOLN
INTRAMUSCULAR | Status: DC | PRN
  Administered 2017-03-19 (×2): 4 mL

## 2017-03-19 MED ORDER — STERILE WATER FOR IRRIGATION IR SOLN
Status: DC | PRN
  Administered 2017-03-19: 1

## 2017-03-19 MED ORDER — OXYTOCIN 10 UNIT/ML IJ SOLN
INTRAMUSCULAR | Status: AC
  Filled 2017-03-19: qty 4

## 2017-03-19 MED ORDER — ONDANSETRON HCL 4 MG/2ML IJ SOLN
INTRAMUSCULAR | Status: AC
  Filled 2017-03-19: qty 2

## 2017-03-19 MED ORDER — SCOPOLAMINE 1 MG/3DAYS TD PT72: 1.0000 | MEDICATED_PATCH | Freq: Once | TRANSDERMAL | Status: DC

## 2017-03-19 MED ORDER — BUPIVACAINE HCL (PF) 0.5 % IJ SOLN
INTRAMUSCULAR | Status: DC | PRN
  Administered 2017-03-19: 30 mL

## 2017-03-19 MED ORDER — CEFAZOLIN SODIUM-DEXTROSE 2-4 GM/100ML-% IV SOLN
INTRAVENOUS | Status: AC
  Filled 2017-03-19: qty 100

## 2017-03-19 MED ORDER — HYDROMORPHONE HCL 1 MG/ML IJ SOLN: 0.2500 mg | INTRAMUSCULAR | Status: DC | PRN

## 2017-03-19 MED ORDER — NALOXONE HCL 0.4 MG/ML IJ SOLN: 0.4000 mg | INTRAMUSCULAR | Status: DC | PRN

## 2017-03-19 MED ORDER — TETANUS-DIPHTH-ACELL PERTUSSIS 5-2.5-18.5 LF-MCG/0.5 IM SUSP: 0.5000 mL | Freq: Once | INTRAMUSCULAR | Status: DC

## 2017-03-19 MED ORDER — SCOPOLAMINE 1 MG/3DAYS TD PT72
MEDICATED_PATCH | TRANSDERMAL | Status: AC
  Filled 2017-03-19: qty 1

## 2017-03-19 MED ORDER — SIMETHICONE 80 MG PO CHEW
80.0000 mg | CHEWABLE_TABLET | Freq: Three times a day (TID) | ORAL | Status: DC
  Administered 2017-03-20 – 2017-03-21 (×5): 80 mg via ORAL
  Filled 2017-03-19 (×5): qty 1

## 2017-03-19 MED ORDER — NALBUPHINE HCL 10 MG/ML IJ SOLN
5.0000 mg | INTRAMUSCULAR | Status: DC | PRN
  Administered 2017-03-20 (×3): 5 mg via INTRAVENOUS
  Filled 2017-03-19 (×3): qty 1

## 2017-03-19 MED ORDER — DIPHENHYDRAMINE HCL 25 MG PO CAPS: 25.0000 mg | ORAL_CAPSULE | Freq: Four times a day (QID) | ORAL | Status: DC | PRN

## 2017-03-19 MED ORDER — PROMETHAZINE HCL 25 MG/ML IJ SOLN: 6.2500 mg | INTRAMUSCULAR | Status: DC | PRN

## 2017-03-19 SURGICAL SUPPLY — 41 items
APL SKNCLS STERI-STRIP NONHPOA (GAUZE/BANDAGES/DRESSINGS) ×1
BARRIER ADHS 3X4 INTERCEED (GAUZE/BANDAGES/DRESSINGS) IMPLANT
BENZOIN TINCTURE PRP APPL 2/3 (GAUZE/BANDAGES/DRESSINGS) ×2 IMPLANT
BRR ADH 4X3 ABS CNTRL BYND (GAUZE/BANDAGES/DRESSINGS)
CHLORAPREP W/TINT 26ML (MISCELLANEOUS) ×3 IMPLANT
CLAMP CORD UMBIL (MISCELLANEOUS) IMPLANT
CLOSURE STERI-STRIP 1/2X4 (GAUZE/BANDAGES/DRESSINGS) ×1
CLOTH BEACON ORANGE TIMEOUT ST (SAFETY) ×3 IMPLANT
CLSR STERI-STRIP ANTIMIC 1/2X4 (GAUZE/BANDAGES/DRESSINGS) ×1 IMPLANT
DECANTER SPIKE VIAL GLASS SM (MISCELLANEOUS) ×2 IMPLANT
DRSG OPSITE POSTOP 4X10 (GAUZE/BANDAGES/DRESSINGS) ×3 IMPLANT
ELECT REM PT RETURN 9FT ADLT (ELECTROSURGICAL) ×3
ELECTRODE REM PT RTRN 9FT ADLT (ELECTROSURGICAL) ×1 IMPLANT
EXTRACTOR VACUUM KIWI (MISCELLANEOUS) IMPLANT
GAUZE SPONGE 4X4 3PLY NS LF (GAUZE/BANDAGES/DRESSINGS) ×2 IMPLANT
GLOVE BIO SURGEON STRL SZ 6.5 (GLOVE) ×2 IMPLANT
GLOVE BIO SURGEONS STRL SZ 6.5 (GLOVE) ×1
GLOVE BIOGEL PI IND STRL 7.0 (GLOVE) ×2 IMPLANT
GLOVE BIOGEL PI INDICATOR 7.0 (GLOVE) ×4
GOWN STRL REUS W/TWL LRG LVL3 (GOWN DISPOSABLE) ×6 IMPLANT
HOVERMATT SINGLE USE (MISCELLANEOUS) ×4 IMPLANT
KIT ABG SYR 3ML LUER SLIP (SYRINGE) IMPLANT
NDL HYPO 25X5/8 SAFETYGLIDE (NEEDLE) IMPLANT
NEEDLE HYPO 22GX1.5 SAFETY (NEEDLE) IMPLANT
NEEDLE HYPO 25X5/8 SAFETYGLIDE (NEEDLE) IMPLANT
NS IRRIG 1000ML POUR BTL (IV SOLUTION) ×3 IMPLANT
PACK C SECTION WH (CUSTOM PROCEDURE TRAY) ×3 IMPLANT
PAD ABD DERMACEA PRESS 5X9 (GAUZE/BANDAGES/DRESSINGS) ×2 IMPLANT
PAD OB MATERNITY 4.3X12.25 (PERSONAL CARE ITEMS) ×3 IMPLANT
PENCIL SMOKE EVAC W/HOLSTER (ELECTROSURGICAL) ×3 IMPLANT
RETRACTOR WND ALEXIS 25 LRG (MISCELLANEOUS) IMPLANT
RTRCTR WOUND ALEXIS 25CM LRG (MISCELLANEOUS)
SUT VIC AB 0 CT1 36 (SUTURE) ×18 IMPLANT
SUT VIC AB 2-0 CT1 27 (SUTURE) ×3
SUT VIC AB 2-0 CT1 TAPERPNT 27 (SUTURE) ×1 IMPLANT
SUT VIC AB 4-0 KS 27 (SUTURE) ×2 IMPLANT
SUT VIC AB 4-0 PS2 27 (SUTURE) ×3 IMPLANT
SYR CONTROL 10ML LL (SYRINGE) IMPLANT
TAPE CLOTH SURG 4X10 WHT LF (GAUZE/BANDAGES/DRESSINGS) ×2 IMPLANT
TOWEL OR 17X24 6PK STRL BLUE (TOWEL DISPOSABLE) ×3 IMPLANT
TRAY FOLEY BAG SILVER LF 14FR (SET/KITS/TRAYS/PACK) IMPLANT

## 2017-03-19 NOTE — Transfer of Care (Signed)
Immediate Anesthesia Transfer of Care Note  Patient: Sarah Collier  Procedure(s) Performed: Procedure(s): CESAREAN SECTION (N/A)  Patient Location: PACU  Anesthesia Type:Epidural  Level of Consciousness: awake  Airway & Oxygen Therapy: Patient Spontanous Breathing  Post-op Assessment: Report given to RN and Post -op Vital signs reviewed and stable  Post vital signs: stable  Last Vitals:  Vitals:   03/19/17 1901 03/19/17 1906  BP: (!) 166/107 136/87  Pulse: (!) 107 74  Resp: 18 16  Temp:      Last Pain:  Vitals:   03/19/17 1625  TempSrc:   PainSc: 5       Patients Stated Pain Goal: 4 (03/19/17 0432)  Complications: No apparent anesthesia complications

## 2017-03-19 NOTE — Progress Notes (Signed)
LABOR PROGRESS NOTE  Sarah Collier is a 26 y.o. G3P0020 at [redacted]w[redacted]d  admitted for IOL for cHTN  Subjective: Pt feeling more comfortable with epidural  Objective: BP (!) 141/94   Pulse 80   Temp 98.5 F (36.9 C) (Oral)   Resp 18   Ht 4\' 11"  (1.499 m)   Wt 177 lb (80.3 kg)   LMP 06/18/2016   BMI 35.75 kg/m  or  Vitals:   03/19/17 1525 03/19/17 1530 03/19/17 1600 03/19/17 1630  BP: (!) 150/100 (!) 150/92 (!) 138/91 (!) 141/94  Pulse: 94 90 85 80  Resp: 18 16 16 18   Temp:      TempSrc:      Weight:      Height:        Last SVE: Dilation: 3 Effacement (%): 70 Cervical Position: Posterior Station: -2 Presentation: Vertex Exam by:: Dr Nira Retort FHT: baseline rate 120, moderate varibility, +acel, early and variable decels Toco: ctx q1-4 min  Assessment / Plan: 26 y.o. G3P0020 at [redacted]w[redacted]d here for IOL for cHTN.   Labor: IUPC placed. Continue to titrate Pitocin Fetal Wellbeing:  Cat II Pain Control:  Epidural Anticipated MOD:  SVD  Frederik Pear, MD 03/19/2017, 5:05 PM

## 2017-03-19 NOTE — Anesthesia Procedure Notes (Signed)
Epidural Patient location during procedure: OB  Staffing Anesthesiologist: Lewie Loron Performed: anesthesiologist   Preanesthetic Checklist Completed: patient identified, pre-op evaluation, timeout performed, IV checked, risks and benefits discussed and monitors and equipment checked  Epidural Patient position: sitting Prep: site prepped and draped and DuraPrep Patient monitoring: heart rate, continuous pulse ox and blood pressure Approach: midline Location: L3-L4 Injection technique: LOR air and LOR saline  Needle:  Needle type: Tuohy  Needle gauge: 17 G Needle length: 9 cm Needle insertion depth: 9 cm Catheter type: closed end flexible Catheter size: 19 Gauge Catheter at skin depth: 14 cm Test dose: negative  Assessment Sensory level: T8 Events: blood not aspirated, injection not painful, no injection resistance, negative IV test and no paresthesia  Additional Notes Reason for block:procedure for pain

## 2017-03-19 NOTE — Progress Notes (Signed)
Patient cervix is unchanged overnight; still thick. Will discuss with on-coming team repeat cytotec vs. Continuing with pitocin. Pitocin has been turned off due to minimal/absent variability at 0630, although now FHR is 130/mod var/present acel, no decels.   Luna Kitchens

## 2017-03-19 NOTE — Op Note (Signed)
Sarah Collier NHAFBX PROCEDURE DATE: 03/19/2017  PREOPERATIVE DIAGNOSES: Intrauterine pregnancy at [redacted]w[redacted]d weeks gestation; non-reassuring fetal status  POSTOPERATIVE DIAGNOSES: The same  PROCEDURE: Primary Low Transverse Cesarean Section  SURGEON:  Adam Phenix, MD  ASSISTANT:  Nolene Ebbs MD  ANESTHESIOLOGY TEAM: Anesthesiologist: Lewie Loron, MD CRNA: Renford Dills, CRNA  INDICATIONS: Sarah Collier is a 26 y.o. [phone] at [redacted]w[redacted]d here for cesarean section secondary to the indications listed under preoperative diagnoses; please see preoperative note for further details.  The risks of cesarean section were discussed with the patient including but were not limited to: bleeding which may require transfusion or reoperation; infection which may require antibiotics; injury to bowel, bladder, ureters or other surrounding organs; injury to the fetus; need for additional procedures including hysterectomy in the event of a life-threatening hemorrhage; placental abnormalities wth subsequent pregnancies, incisional problems, thromboembolic phenomenon and other postoperative/anesthesia complications.   The patient concurred with the proposed plan, giving informed written consent for the procedure.    FINDINGS:  Viable female infant in cephalic presentation.  Apgars 8 and 9.  Clear amniotic fluid.  Intact placenta, three vessel cord.  Normal uterus, fallopian tubes and ovaries bilaterally.  ANESTHESIA: Epidural  INTRAVENOUS FLUIDS: 1500 ml   ESTIMATED BLOOD LOSS: 500 ml URINE OUTPUT:  100 ml SPECIMENS: Placenta sent to L&D COMPLICATIONS: None immediate  PROCEDURE IN DETAIL:  The patient preoperatively received intravenous antibiotics and had sequential compression devices applied to her lower extremities.  She was then taken to the operating room where the epidural anesthesia was dosed up to surgical level and was found to be adequate. She was then placed in a dorsal supine position with a leftward  tilt, and prepped and draped in a sterile manner.  A foley catheter was placed into her bladder and attached to constant gravity.  After an adequate timeout was performed, a Pfannenstiel skin incision was made with scalpel and carried through to the underlying layer of fascia. The fascia was incised in the midline, and this incision was extended bilaterally using the Mayo scissors.  Kocher clamps were applied to the superior aspect of the fascial incision and the underlying rectus muscles were dissected off bluntly.  A similar process was carried out on the inferior aspect of the fascial incision. The rectus muscles were separated in the midline bluntly and the peritoneum was entered bluntly. Attention was turned to the lower uterine segment where a low transverse hysterotomy was made with a scalpel and extended bilaterally bluntly.  The infant was successfully delivered, the cord was clamped and cut after one minute, and the infant was handed over to the awaiting neonatology team. Uterine massage was then administered, and the placenta delivered intact with a three-vessel cord. The uterus was then cleared of clots and debris.  The hysterotomy was closed with 0 Vicryl in a running locked fashion, and an imbricating layer was also placed with 0 Vicryl.   The pelvis was cleared of all clot and debris. Hemostasis was confirmed on all surfaces.  The peritoneum was closed with a 0 Vicryl running stitch. The fascia was then closed using 0 Vicryl in a running fashion.  The subcutaneous layer was irrigated,  and 30 ml of 0.5% Marcaine was injected subcutaneously around the incision.  The skin was closed with a 4-0 Vicryl subcuticular stitch. The patient tolerated the procedure well. Sponge, lap, instrument and needle counts were correct x 3.  She was taken to the recovery room in stable condition.  Adam Phenix, MD Attending Obstetrician & Gynecologist Faculty Practice, [facility]

## 2017-03-19 NOTE — Progress Notes (Signed)
CARMINE ESHLEMAN is a 26 y.o. G3P0020 at [redacted]w[redacted]d admitted for induction of labor due to Hypertension.  Subjective:   Objective: BP 136/87 Comment: BP recheck  Pulse 74   Temp 98.5 F (36.9 C) (Oral)   Resp 16   Ht 4\' 11"  (1.499 m)   Wt 80.3 kg (177 lb)   LMP 06/18/2016   BMI 35.75 kg/m  I/O last 3 completed shifts: In: -  Out: 800 [Urine:800] No intake/output data recorded.  FHT:  Fetal Heart Rate A  Mode External filed at 03/18/2017 1940  Baseline Rate (A) 115 bpm filed at 03/19/2017 1916  Variability <5 BPM filed at 03/19/2017 1916  Accelerations 15 x 15 filed at 03/19/2017 1916  Decelerations Late, Prolonged filed at 03/19/2017 1916  Scalp Stimulation Positive filed at 03/19/2017 1630    UC:   irregular, every 2-4 minutes SVE:   Dilation: 5 Effacement (%): 90 Station: -2 Exam by:: Hazel Sams RN  Labs: Lab Results  Component Value Date   WBC 8.1 03/19/2017   HGB 12.2 03/19/2017   HCT 37.0 03/19/2017   MCV 89.2 03/19/2017   PLT 249 03/19/2017    Assessment / Plan: Arrest of decent  Labor: slow progress Preeclampsia:  CHTN Fetal Wellbeing:  Category III Pain Control:  Epidural I/D:  n/a Anticipated MOD:  Cesarean section for fetal intolerance of labor. The risks of cesarean section discussed with the patient included but were not limited to: bleeding which may require transfusion or reoperation; infection which may require antibiotics; injury to bowel, bladder, ureters or other surrounding organs; injury to the fetus; need for additional procedures including hysterectomy in the event of a life-threatening hemorrhage; placental abnormalities wth subsequent pregnancies, incisional problems, thromboembolic phenomenon and other postoperative/anesthesia complications. The patient concurred with the proposed plan, giving informed written consent for the procedure.   Patient has been NPO since yesterday she will remain NPO for procedure. Anesthesia and OR aware.  Preoperative prophylactic antibiotics and SCDs ordered on call to the OR.  To OR when ready.    Scheryl Darter 03/19/2017, 7:22 PM

## 2017-03-19 NOTE — Progress Notes (Signed)
SVE 3/30/-3 Will do another dose of cytotec for ripening

## 2017-03-19 NOTE — Progress Notes (Signed)
LABOR PROGRESS NOTE  Sarah Collier is a 26 y.o. G3P0020 at [redacted]w[redacted]d  admitted for IOL for cHTN  Subjective: Pt feeling more contractions. Would like epidural  Objective: BP (!) 143/94   Pulse 90   Temp 98.2 F (36.8 C) (Axillary)   Resp 18   Ht 4\' 11"  (1.499 m)   Wt 177 lb (80.3 kg)   LMP 06/18/2016   BMI 35.75 kg/m  or  Vitals:   03/19/17 1230 03/19/17 1300 03/19/17 1330 03/19/17 1400  BP: (!) 107/55 127/82 (!) 133/101 (!) 143/94  Pulse: 88 92 93 90  Resp: 16 16 18 18   Temp:      TempSrc:      Weight:      Height:        Last SVE: Dilation: 3 Effacement (%): 50 Cervical Position: Posterior Station: -2 Presentation: Vertex Exam by:: Dr Nira Retort FHT: baseline rate 120, moderate varibility, +acel, early decels Toco: ctx q2-5 min  Bedside U/S: cephalic, OP  Assessment / Plan: 26 y.o. G3P0020 at [redacted]w[redacted]d here for IOL for cHTN. S/p several doses of cytotec, last at 9am. Fetus OP on U/S.   Labor: Restart Pitocin. Peanut to help with fetal position Fetal Wellbeing:  Cat I Pain Control:  Planning to get epidural Anticipated MOD:  SVD  Frederik Pear, MD 03/19/2017, 2:23 PM

## 2017-03-19 NOTE — Anesthesia Preprocedure Evaluation (Signed)
Anesthesia Evaluation  Patient identified by MRN, date of birth, ID band Patient awake    Reviewed: Allergy & Precautions, Patient's Chart, lab work & pertinent test results, reviewed documented beta blocker date and time   Airway Mallampati: II  TM Distance: >3 FB Neck ROM: Full    Dental no notable dental hx.    Pulmonary former smoker,    Pulmonary exam normal breath sounds clear to auscultation       Cardiovascular hypertension, Pt. on medications and Pt. on home beta blockers Normal cardiovascular exam Rhythm:Regular Rate:Normal     Neuro/Psych negative neurological ROS  negative psych ROS   GI/Hepatic negative GI ROS, Neg liver ROS,   Endo/Other  negative endocrine ROS  Renal/GU negative Renal ROS     Musculoskeletal negative musculoskeletal ROS (+)   Abdominal (+) + obese,   Peds  Hematology negative hematology ROS (+)   Anesthesia Other Findings   Reproductive/Obstetrics (+) Pregnancy                             Anesthesia Physical Anesthesia Plan  ASA: II  Anesthesia Plan: Epidural   Post-op Pain Management:    Induction:   PONV Risk Score and Plan:   Airway Management Planned:   Additional Equipment:   Intra-op Plan:   Post-operative Plan:   Informed Consent: I have reviewed the patients History and Physical, chart, labs and discussed the procedure including the risks, benefits and alternatives for the proposed anesthesia with the patient or authorized representative who has indicated his/her understanding and acceptance.     Plan Discussed with:   Anesthesia Plan Comments:         Anesthesia Quick Evaluation

## 2017-03-20 ENCOUNTER — Other Ambulatory Visit: Payer: Self-pay | Admitting: Certified Nurse Midwife

## 2017-03-20 ENCOUNTER — Encounter (HOSPITAL_COMMUNITY): Payer: Self-pay | Admitting: Obstetrics & Gynecology

## 2017-03-20 LAB — CBC
HCT: 33.7 % — ABNORMAL LOW (ref 36.0–46.0)
Hemoglobin: 11.3 g/dL — ABNORMAL LOW (ref 12.0–15.0)
MCH: 30 pg (ref 26.0–34.0)
MCHC: 33.5 g/dL (ref 30.0–36.0)
MCV: 89.4 fL (ref 78.0–100.0)
PLATELETS: 235 10*3/uL (ref 150–400)
RBC: 3.77 MIL/uL — AB (ref 3.87–5.11)
RDW: 14.4 % (ref 11.5–15.5)
WBC: 11.5 10*3/uL — ABNORMAL HIGH (ref 4.0–10.5)

## 2017-03-20 MED ORDER — CEFAZOLIN SODIUM-DEXTROSE 2-4 GM/100ML-% IV SOLN: 2.0000 g | INTRAVENOUS | Status: DC

## 2017-03-20 MED ORDER — AMLODIPINE BESYLATE 5 MG PO TABS
5.0000 mg | ORAL_TABLET | Freq: Every day | ORAL | Status: DC
  Administered 2017-03-20 – 2017-03-21 (×2): 5 mg via ORAL
  Filled 2017-03-20 (×3): qty 1

## 2017-03-20 MED ORDER — LACTATED RINGERS IV SOLN: INTRAVENOUS | Status: DC

## 2017-03-20 NOTE — Anesthesia Postprocedure Evaluation (Signed)
Anesthesia Post Note  Patient: Sarah Collier  Procedure(s) Performed: Procedure(s) (LRB): CESAREAN SECTION (N/A)     Patient location during evaluation: Mother Baby Anesthesia Type: Epidural Level of consciousness: awake and alert Pain management: pain level controlled Vital Signs Assessment: post-procedure vital signs reviewed and stable Respiratory status: spontaneous breathing, nonlabored ventilation and respiratory function stable Cardiovascular status: stable Postop Assessment: no headache, no backache and epidural receding Anesthetic complications: no    Last Vitals:  Vitals:   03/20/17 0100 03/20/17 0201  BP: 136/82 (!) 141/86  Pulse: 85 74  Resp: 16 16  Temp: 37 C 37.1 C  SpO2: 96% 95%    Last Pain:  Vitals:   03/20/17 0201  TempSrc: Oral  PainSc: 0-No pain   Pain Goal: Patients Stated Pain Goal: 4 (03/19/17 0432)               Lewie Loron

## 2017-03-20 NOTE — Progress Notes (Signed)
Post OP DAY #1 Subjective: no complaints, up ad lib and tolerating PO  Objective: Blood pressure (!) 149/92, pulse 75, temperature 98.8 F (37.1 C), temperature source Oral, resp. rate 18, height 4\' 11"  (1.499 m), weight 80.3 kg (177 lb), last menstrual period 06/18/2016, SpO2 95 %, unknown if currently breastfeeding.  Physical Exam:  General: alert Lochia: appropriate Uterine Fundus: firm DVT Evaluation: No evidence of DVT seen on physical exam.   Recent Labs  03/19/17 1356 03/20/17 0513  HGB 12.2 11.3*  HCT 37.0 33.7*    Assessment/Plan: Continue current care Bottle feeding   LOS: 2 days   Doninique Lwin C Halyn Flaugher 03/20/2017, 7:20 AM

## 2017-03-20 NOTE — Anesthesia Postprocedure Evaluation (Signed)
Anesthesia Post Note  Patient: Sarah Collier  Procedure(s) Performed: Procedure(s) (LRB): CESAREAN SECTION (N/A)     Patient location during evaluation: Mother Baby Anesthesia Type: Epidural Level of consciousness: awake and alert and oriented Pain management: pain level controlled Vital Signs Assessment: post-procedure vital signs reviewed and stable Respiratory status: spontaneous breathing and nonlabored ventilation Cardiovascular status: stable Postop Assessment: no headache, no backache, patient able to bend at knees, no signs of nausea or vomiting and adequate PO intake Anesthetic complications: no    Last Vitals:  Vitals:   03/20/17 0201 03/20/17 0310  BP: (!) 141/86 (!) 149/92  Pulse: 74 75  Resp: 16 18  Temp: 37.1 C   SpO2: 95%     Last Pain:  Vitals:   03/20/17 0507  TempSrc:   PainSc: 3    Pain Goal: Patients Stated Pain Goal: 4 (03/19/17 0432)               Madison Hickman

## 2017-03-20 NOTE — Addendum Note (Signed)
Addendum  created 03/20/17 0813 by Shanon Payor, CRNA   Sign clinical note

## 2017-03-21 LAB — BIRTH TISSUE RECOVERY COLLECTION (PLACENTA DONATION)

## 2017-03-21 MED ORDER — AMLODIPINE BESYLATE 10 MG PO TABS: 10.0000 mg | ORAL_TABLET | Freq: Every day | ORAL | 1 refills | Status: DC

## 2017-03-21 MED ORDER — OXYCODONE HCL 5 MG PO TABS: 5.0000 mg | ORAL_TABLET | ORAL | 0 refills | Status: DC | PRN

## 2017-03-21 MED ORDER — NORGESTREL-ETHINYL ESTRADIOL 0.3-30 MG-MCG PO TABS: 1.0000 | ORAL_TABLET | Freq: Every day | ORAL | 11 refills | Status: DC

## 2017-03-21 MED ORDER — IBUPROFEN 600 MG PO TABS
600.0000 mg | ORAL_TABLET | Freq: Four times a day (QID) | ORAL | 0 refills | Status: DC
Start: 2017-03-21 — End: 2022-05-31

## 2017-03-21 NOTE — Progress Notes (Signed)
This RN called Cam Hai CNM for clarification on NPO and preop orders for this patient. Per K. Clelia Croft CNM orders can be discontinued. Called to inform house coverage of happenings. Per Arvilla Meres from house coverage; Dr. Alysia Penna also gives permission to have orders discontinued.

## 2017-03-21 NOTE — Discharge Instructions (Signed)

## 2017-03-21 NOTE — Discharge Summary (Signed)
OB Discharge Summary  Patient Name: Sarah Collier DOB: 02-Mar-1991 MRN: [id number]  Date of admission: 03/18/2017 Delivering MD: Adam Phenix   Date of discharge: 03/21/2017  Admitting diagnosis: 39wks, induction Intrauterine pregnancy: [redacted]w[redacted]d     Secondary diagnosis:Active Problems:   Chronic hypertension during pregnancy, antepartum  Additional problems: morbid obesity, +THC     Discharge diagnosis: Term Pregnancy Delivered                                                                      Augmentation: Pitocin and Cytotec  Complications: None  Hospital course:  Induction of Labor With Cesarean Section  26 y.o. yo [id number] at [redacted]w[redacted]d was admitted to the hospital 03/18/2017 for induction of labor. Patient had a labor course significant for [facility]. The patient went for cesarean section due to Non-Reassuring FHR, and delivered a Viable infant,@BABYSUPPRESS (DBLINK,ept,110,,1,,) Membrane Rupture Time/Date: )1:15 PM ,03/19/2017   @Details  of operation can be found in separate operative Note.  Patient had an uncomplicated postpartum course. She is ambulating, tolerating a regular diet, passing flatus, and urinating well.  Patient is discharged home in stable condition on 03/21/17.                                    Physical exam  Vitals:   03/20/17 1000 03/20/17 1349 03/20/17 1752 03/20/17 2200  BP: (!) 119/55 104/74 (!) 141/88 137/87  Pulse: 69 63 87 86  Resp: 18 18 18 20   Temp: 98.3 F (36.8 C) (!) 97.4 F (36.3 C) (!) 97.4 F (36.3 C) 98.5 F (36.9 C)  TempSrc: Oral Oral Oral Oral  SpO2:      Weight:      Height:       General: alert Lochia: appropriate Uterine Fundus: firm and NT at U-1 Incision: Honeycomb dressing is clean, dry, and intact DVT Evaluation: No evidence of DVT seen on physical exam. Labs: Lab Results  Component Value Date   WBC 11.5 (H) 03/20/2017   HGB 11.3 (L) 03/20/2017   HCT 33.7 (L) 03/20/2017   MCV 89.4 03/20/2017   PLT 235 03/20/2017    CMP Latest Ref Rng & Units 03/18/2017  Glucose 65 - 99 mg/dL 91  BUN 6 - 20 mg/dL 11  Creatinine 2.95 - 6.21 mg/dL 3.08  Sodium 657 - 846 mmol/L 135  Potassium 3.5 - 5.1 mmol/L 4.2  Chloride 101 - 111 mmol/L 103  CO2 22 - 32 mmol/L 22  Calcium 8.9 - 10.3 mg/dL 9.2  Total Protein 6.5 - 8.1 g/dL 7.1  Total Bilirubin 0.3 - 1.2 mg/dL 0.3  Alkaline Phos 38 - 126 U/L 103  AST 15 - 41 U/L 22  ALT 14 - 54 U/L 13(L)    Discharge instruction: per After Visit Summary and "Baby and Me Booklet".  After Visit Meds:  Allergies as of 03/21/2017   No Known Allergies     Medication List    STOP taking these medications   cephALEXin 500 MG capsule Commonly known as:  KEFLEX   labetalol 200 MG tablet Commonly known as:  NORMODYNE     TAKE these medications   amLODipine  10 MG tablet Commonly known as:  NORVASC Take 1 tablet (10 mg total) by mouth daily.   COMFORT FIT MATERNITY SUPP LG Misc 1 Units by Does not apply route daily.   ibuprofen 600 MG tablet Commonly known as:  ADVIL,MOTRIN Take 1 tablet (600 mg total) by mouth every 6 (six) hours.   norgestrel-ethinyl estradiol 0.3-30 MG-MCG tablet Commonly known as:  LO/OVRAL,CRYSELLE Take 1 tablet by mouth daily. Start pills when baby is 22 weeks old.   oxyCODONE 5 MG immediate release tablet Commonly known as:  Oxy IR/ROXICODONE Take 1 tablet (5 mg total) by mouth every 4 (four) hours as needed (pain scale 4-7).   PRENATE PIXIE 10-0.6-0.4-200 MG Caps Take 1 tablet by mouth daily.   Vitamin D (Ergocalciferol) 50000 units Caps capsule Commonly known as:  DRISDOL Take 1 capsule (50,000 Units total) by mouth every 7 (seven) days.       Diet: routine diet  Activity: Advance as tolerated. Pelvic rest for 6 weeks.   Outpatient follow up:2 weeks Follow up Appt:No future appointments. Follow up visit: No Follow-up on file.  Postpartum contraception: Combination OCPs  Newborn Data: Live born female  Birth Weight: 5 lb 13.8  oz (2660 g) APGAR: 8, 9  Baby Feeding: Bottle Disposition:home with mother   03/21/2017 Allie Bossier, MD

## 2017-03-21 NOTE — Progress Notes (Signed)
CSW received consult for hx of marijuana use.  Referral was screened out due to the following: °~MOB had no documented substance use after initial prenatal visit/+UPT. °~MOB had no positive drug screens after initial prenatal visit/+UPT. °~Baby's UDS is negative. ° °Please consult CSW if current concerns arise or by MOB's request. ° °CSW will monitor CDS results and make report to Child Protective Services if warranted. ° °Keonna Raether Boyd-Gilyard, MSW, LCSW °Clinical Social Work °[phone] ° °

## 2017-04-06 ENCOUNTER — Ambulatory Visit (INDEPENDENT_AMBULATORY_CARE_PROVIDER_SITE_OTHER): Payer: Medicaid Other | Admitting: Certified Nurse Midwife

## 2017-04-06 ENCOUNTER — Encounter: Payer: Self-pay | Admitting: Certified Nurse Midwife

## 2017-04-06 VITALS — BP 153/99 | HR 86 | Wt 164.0 lb

## 2017-04-06 DIAGNOSIS — I10 Essential (primary) hypertension: Secondary | ICD-10-CM

## 2017-04-06 DIAGNOSIS — Z98891 History of uterine scar from previous surgery: Secondary | ICD-10-CM

## 2017-04-06 HISTORY — DX: Essential (primary) hypertension: I10

## 2017-04-06 HISTORY — DX: History of uterine scar from previous surgery: Z98.891

## 2017-04-06 NOTE — Progress Notes (Signed)
Pt is in office for incision check today. Pt states no problem with incision.  Pt states that she is taking Amlodipine but not consistently. Pt BP is elevated today. Pt states she may need referral to PCP if BP remains elevated for management after PP period.

## 2017-04-06 NOTE — Progress Notes (Signed)
Patient ID: Sarah Collier, female   DOB: [birth year], 26 y.o.   MRN: [id number]  Chief Complaint  Patient presents with  . Wound Check    incision check    HPI Sarah Collier is a 26 y.o. female.  Here for 2 week check up after LTCS on 03/19/17.  Is currently having some spotting.  Started taking OCPs on Sunday, ACHES reviewed.  Bottle feeding.  Denies any problems with depression.  Is about 2 weeks post-op, denies any problems.  Is not currently taking her medications for her blood pressure regularly as she is forgetting to take them.  Denies HA, vision changes, epigastric pain.  Encouraged to take medications, nurse visit blood pressure next week with medications.      HPI  Past Medical History:  Diagnosis Date  . Infection    gonorrhea, chlamydia  . PID (acute pelvic inflammatory disease)   . Pregnancy induced hypertension     Past Surgical History:  Procedure Laterality Date  . CESAREAN SECTION N/A 03/19/2017   Procedure: CESAREAN SECTION;  Surgeon: Adam Phenix, MD;  Location: [facility] BIRTHING SUITES;  Service: Obstetrics;  Laterality: N/A;  . THERAPEUTIC ABORTION    . WISDOM TOOTH EXTRACTION      Family History  Problem Relation Age of Onset  . Hypertension Mother   . Asthma Father   . Hypertension Father   . Diabetes Maternal Grandmother   . Hearing loss Neg Hx     Social History Social History  Substance Use Topics  . Smoking status: Former Smoker    Years: 2.00    Types: Cigars    Quit date: 07/06/2016  . Smokeless tobacco: Never Used  . Alcohol use No     Comment: Patient reports drinking alcohol every other week, once or twice    No Known Allergies  Current Outpatient Prescriptions  Medication Sig Dispense Refill  . amLODipine (NORVASC) 10 MG tablet Take 1 tablet (10 mg total) by mouth daily. 31 tablet 1  . ibuprofen (ADVIL,MOTRIN) 600 MG tablet Take 1 tablet (600 mg total) by mouth every 6 (six) hours. 30 tablet 0  . norgestrel-ethinyl estradiol  (LO/OVRAL,CRYSELLE) 0.3-30 MG-MCG tablet Take 1 tablet by mouth daily. Start pills when baby is 62 weeks old. 1 Package 11  . Prenat-FeAsp-Meth-FA-DHA w/o A (PRENATE PIXIE) 10-0.6-0.4-200 MG CAPS Take 1 tablet by mouth daily. 30 capsule 12  . Elastic Bandages & Supports (COMFORT FIT MATERNITY SUPP LG) MISC 1 Units by Does not apply route daily. 1 each 0  . oxyCODONE (OXY IR/ROXICODONE) 5 MG immediate release tablet Take 1 tablet (5 mg total) by mouth every 4 (four) hours as needed (pain scale 4-7). (Patient not taking: Reported on 04/06/2017) 30 tablet 0   No current facility-administered medications for this visit.     Review of Systems Review of Systems Constitutional: negative for fatigue and weight loss Respiratory: negative for cough and wheezing Cardiovascular: negative for chest pain, fatigue and palpitations Gastrointestinal: negative for abdominal pain and change in bowel habits Genitourinary:negative Integument/breast: negative for nipple discharge Musculoskeletal:negative for myalgias Neurological: negative for gait problems and tremors Behavioral/Psych: negative for abusive relationship, depression Endocrine: negative for temperature intolerance      Blood pressure (!) 153/99, pulse 86, weight 164 lb (74.4 kg), unknown if currently breastfeeding.  Physical Exam Physical Exam General:   alert  Skin:   no rash or abnormalities  Lungs:   clear to auscultation bilaterally  Heart:   regular rate and rhythm,  S1, S2 normal, no murmur, click, rub or gallop  Breasts:   deferred  Abdomen:  normal findings: no organomegaly, soft, non-tender and no hernia C-section wound: well approximated, no s/s infection, non-tender, healed  Pelvis: deferred    50% of 15 min visit spent on counseling and coordination of care.    Data Reviewed Previous medical hx, meds, labs  Assessment     Normal pap smear: 09/14/16 Hypertension: not taking medications currently S/P repeat LTCS: healing     Plan   F/U next week nurse visit B/P check   F/U 4 weeks postpartum exam   Annual exam 09/15/17.     Continue OCPs since starting early with ACHES reviewed

## 2017-04-10 NOTE — Progress Notes (Signed)
Agree with nursing staff's documentation of this patient's clinic encounter.  Mcadoo Muzquiz A Leocadia Idleman, CNM    

## 2017-04-14 ENCOUNTER — Ambulatory Visit: Payer: Medicaid Other

## 2017-04-14 VITALS — BP 127/90

## 2017-04-14 DIAGNOSIS — I10 Essential (primary) hypertension: Secondary | ICD-10-CM

## 2017-04-14 NOTE — Progress Notes (Signed)
Subjective:  Sarah Collier is a 26 y.o. female with hypertension. Current Outpatient Prescriptions  Medication Sig Dispense Refill  . amLODipine (NORVASC) 10 MG tablet Take 1 tablet (10 mg total) by mouth daily. 31 tablet 1  . ibuprofen (ADVIL,MOTRIN) 600 MG tablet Take 1 tablet (600 mg total) by mouth every 6 (six) hours. 30 tablet 0  . norgestrel-ethinyl estradiol (LO/OVRAL,CRYSELLE) 0.3-30 MG-MCG tablet Take 1 tablet by mouth daily. Start pills when baby is 83 weeks old. 1 Package 11  . Prenat-FeAsp-Meth-FA-DHA w/o A (PRENATE PIXIE) 10-0.6-0.4-200 MG CAPS Take 1 tablet by mouth daily. 30 capsule 12  . Elastic Bandages & Supports (COMFORT FIT MATERNITY SUPP LG) MISC 1 Units by Does not apply route daily. (Patient not taking: Reported on 04/14/2017) 1 each 0  . oxyCODONE (OXY IR/ROXICODONE) 5 MG immediate release tablet Take 1 tablet (5 mg total) by mouth every 4 (four) hours as needed (pain scale 4-7). (Patient not taking: Reported on 04/06/2017) 30 tablet 0   No current facility-administered medications for this visit.     Hypertension ROS: taking medications as instructed, no medication side effects noted, no TIA's, no chest pain on exertion, no dyspnea on exertion, no swelling of ankles    New concerns: None per pt  Objective:   Appearance alert, well appearing, and in no distress. General exam BP noted to be well controlled today in office.    Assessment:   Hypertension stable.   Plan:  Current treatment plan is effective, no change in therapy. per Dr. Clearance Coots  Pt to f/u next visit.

## 2017-04-19 ENCOUNTER — Ambulatory Visit (INDEPENDENT_AMBULATORY_CARE_PROVIDER_SITE_OTHER): Payer: Medicaid Other | Admitting: Certified Nurse Midwife

## 2017-04-19 VITALS — BP 138/94 | HR 83 | Wt 166.0 lb

## 2017-04-19 DIAGNOSIS — Z1389 Encounter for screening for other disorder: Secondary | ICD-10-CM

## 2017-04-19 DIAGNOSIS — I10 Essential (primary) hypertension: Secondary | ICD-10-CM

## 2017-04-19 MED ORDER — HYDROCHLOROTHIAZIDE 25 MG PO TABS: 25.0000 mg | ORAL_TABLET | Freq: Every day | ORAL | 5 refills | Status: DC

## 2017-04-19 NOTE — Progress Notes (Signed)
Post Partum Exam  Sarah Collier is a 26 y.o. [id number] female who presents for a postpartum visit. She is 4 weeks postpartum following a low cervical transverse Cesarean section. I have fully reviewed the prenatal and intrapartum course. The delivery was at 39 gestational weeks.  Anesthesia: spinal. Postpartum course has been doing well. Baby's course has been doing well. Baby is feeding by bottle - Gerber gentle. Bleeding no bleeding. Bowel function is normal. Bladder function is normal. Patient is not sexually active. Contraception method is Pill. Postpartum depression screening:neg, score 3.  The following portions of the patient's history were reviewed and updated as appropriate: allergies, current medications, past family history, past medical history, past social history, past surgical history and problem list.  Review of Systems Pertinent items noted in HPI and remainder of comprehensive ROS otherwise negative.    Objective:  unknown if currently breastfeeding.  General:  alert, cooperative and no distress   Breasts:  inspection negative, no nipple discharge or bleeding, no masses or nodularity palpable  Lungs: clear to auscultation bilaterally  Heart:  regular rate and rhythm, S1, S2 normal, no murmur, click, rub or gallop  Abdomen: soft, non-tender; bowel sounds normal; no masses,  no organomegaly  Pelvic Exam: Not performed.        Assessment:    Normal 4 week postpartum exam. Pap smear not done at today's visit.   Hx of HTN: started on HCTZ   Plan:   1. Contraception: abstinence, OCP was started before last exam at discharge from hospital.  ACHES reviewed.  2. Nurse visit B/P check in 1 week.  Is on Norvasc 10 mg.  3. Follow up in: 5 months for annual exam or as needed.

## 2017-04-20 ENCOUNTER — Encounter: Payer: Self-pay | Admitting: Certified Nurse Midwife

## 2017-08-29 IMAGING — US US OB LIMITED
1 series · 6 of 6 positions shown · non-contrast
Comparison: none

[Series 1: us ob limited · 0.19mm/px · 6 of 6 slices shown]
[im 1/6]
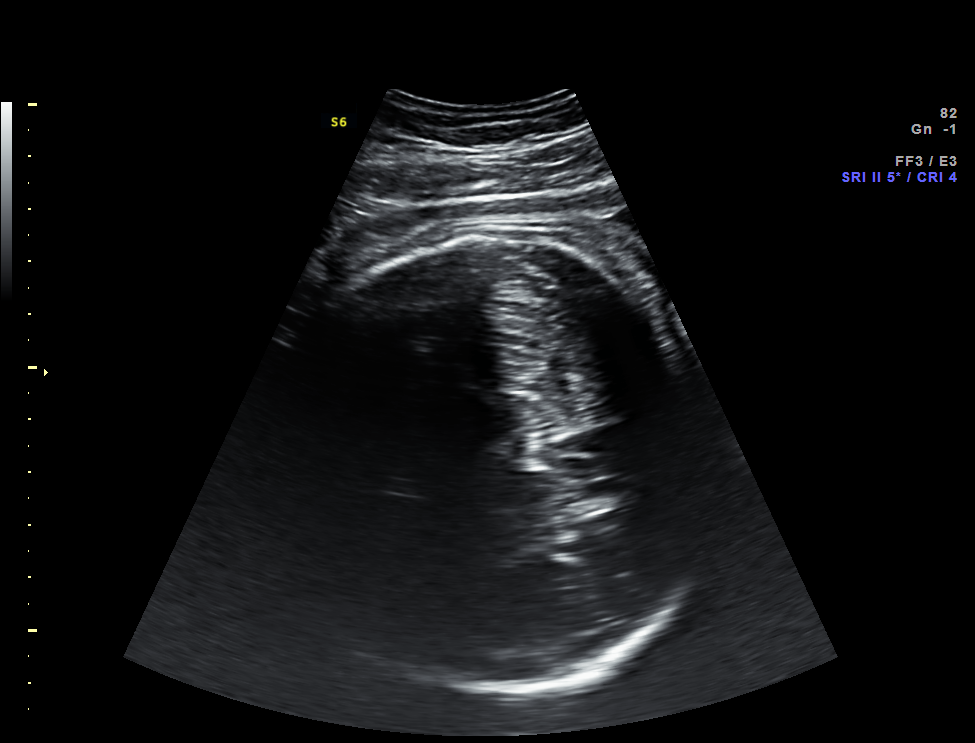
[im 2/6]
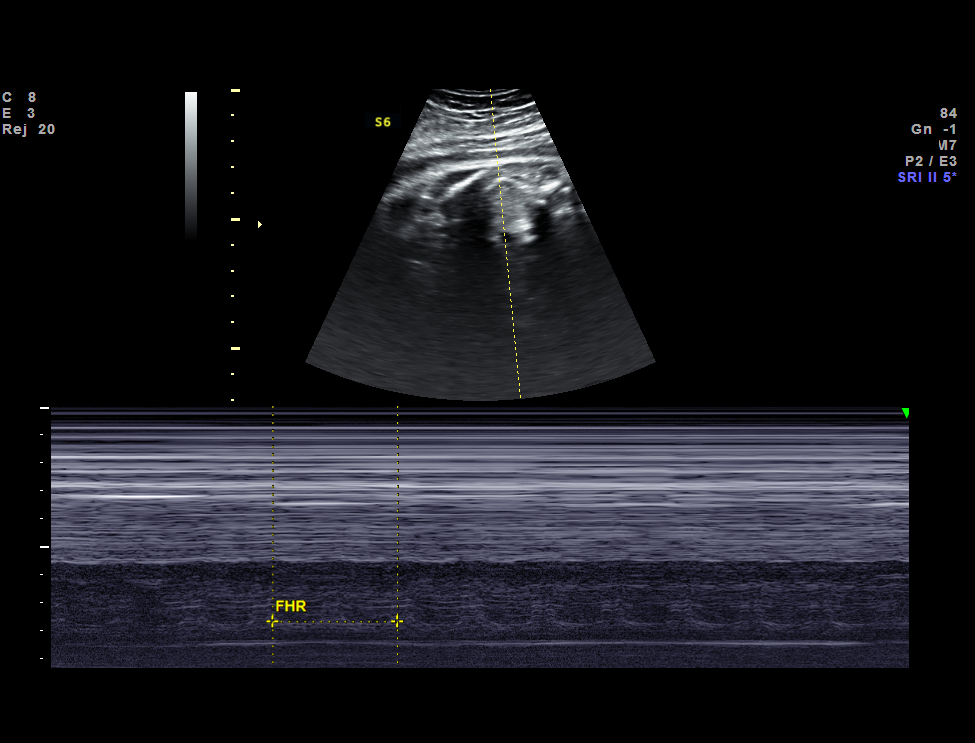
[im 3/6]
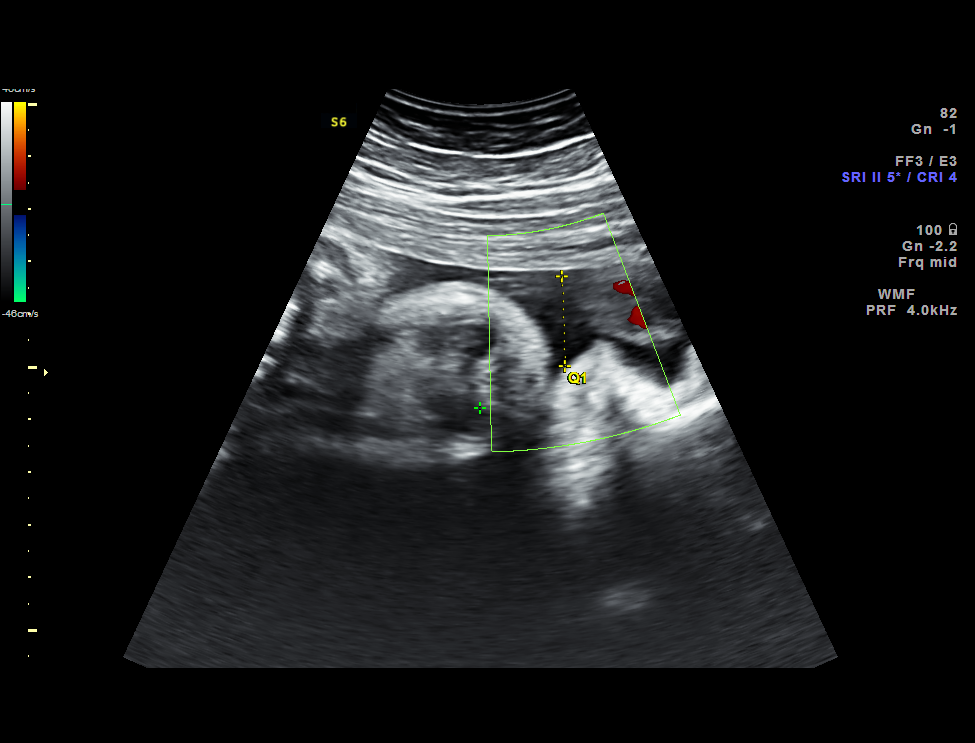
[im 4/6]
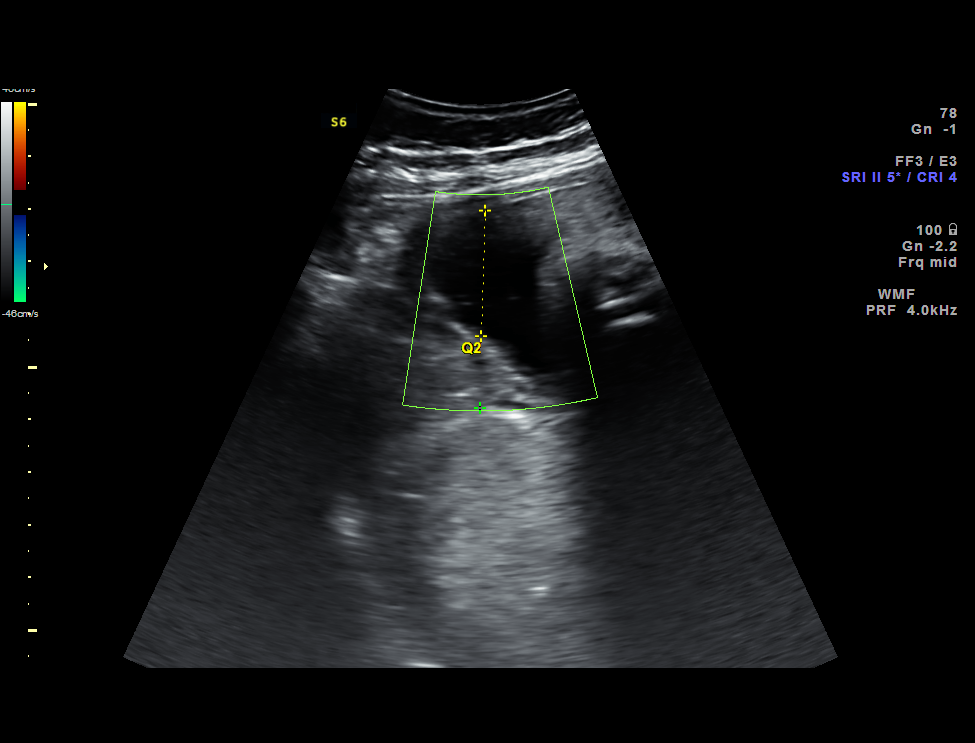
[im 5/6]
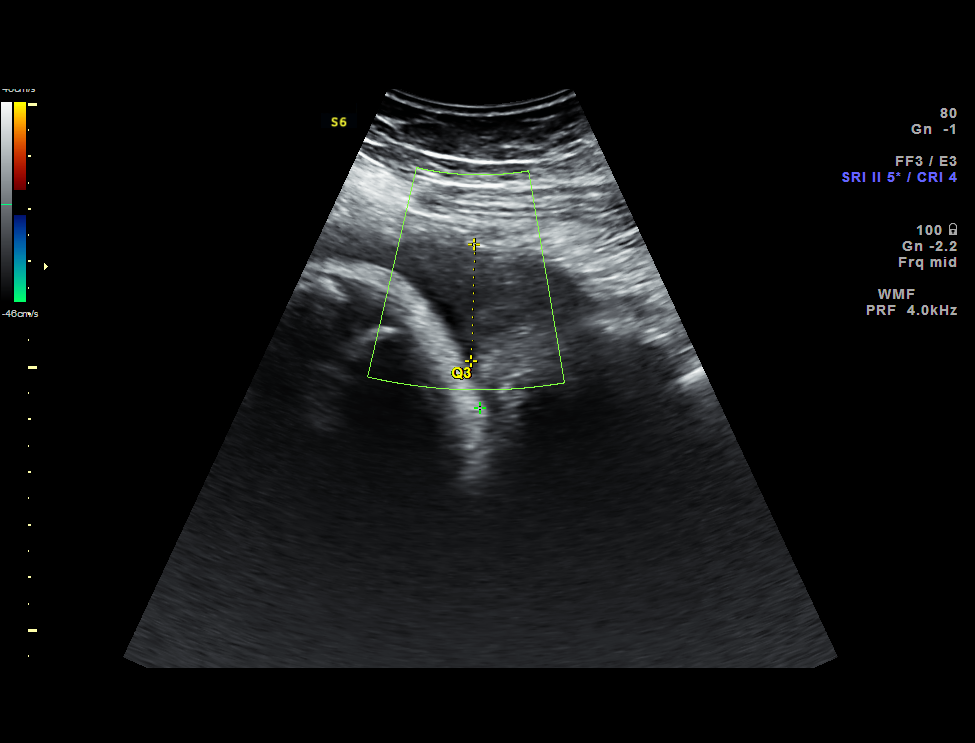
[im 6/6]
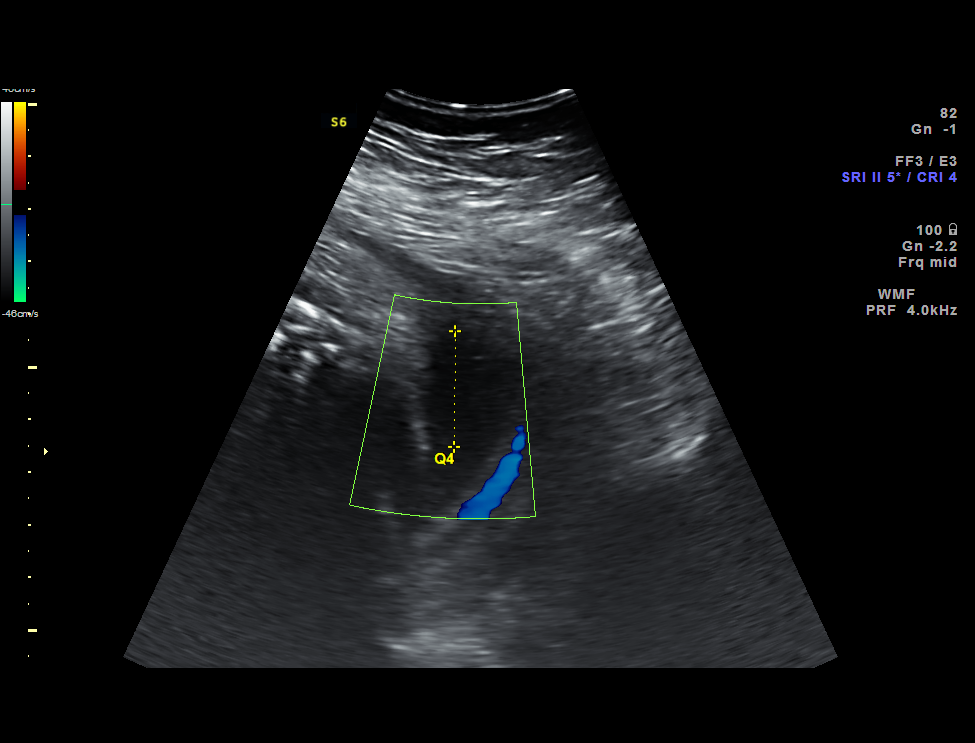

[6 of 6 positions shown; findings below may reference images not displayed]

[facility]
Road [HOSPITAL]
[REDACTED]care

1  [HOSPITAL]                               [address].0

Service(s) Provided

OB Limited                                           [id number]
Indications

36 weeks gestation of pregnancy
Hypertension - Gestational
OB History

Gravidity:    3
TOP:          2
Fetal Evaluation

Num Of Fetuses:     1
Preg. Location:     Intrauterine
Fetal Heart         144
Rate(bpm):
Cardiac Activity:   Observed
Presentation:       Vertex

Amniotic Fluid
AFI FV:      Subjectively within normal limits

AFI Sum(cm)     %Tile       Largest Pocket(cm)
8.52            12

RUQ(cm)       RLQ(cm)       LUQ(cm)        LLQ(cm)
1.71
Gestational Age

LMP:           36w 3d        Date:  06/18/16                 EDD:   03/25/17
Best:          36w 3d     Det. By:  LMP  (06/18/16)          EDD:   03/25/17
Comments

Normal amniotic fluid volume.
Impression

Normal AFI
Vertex presentation
Recommendations

Continue antenatal testing and serial ultrasound for fetal
growth.

## 2017-09-12 IMAGING — US US MFM FETAL BPP W/O NON-STRESS
1 series · 12 of 15 positions shown · non-contrast
Comparison: none

[Series 1: us mfm fetal bpp w/o non-stress · 15 acquisitions, 12 frames shown]
[im 1/15]
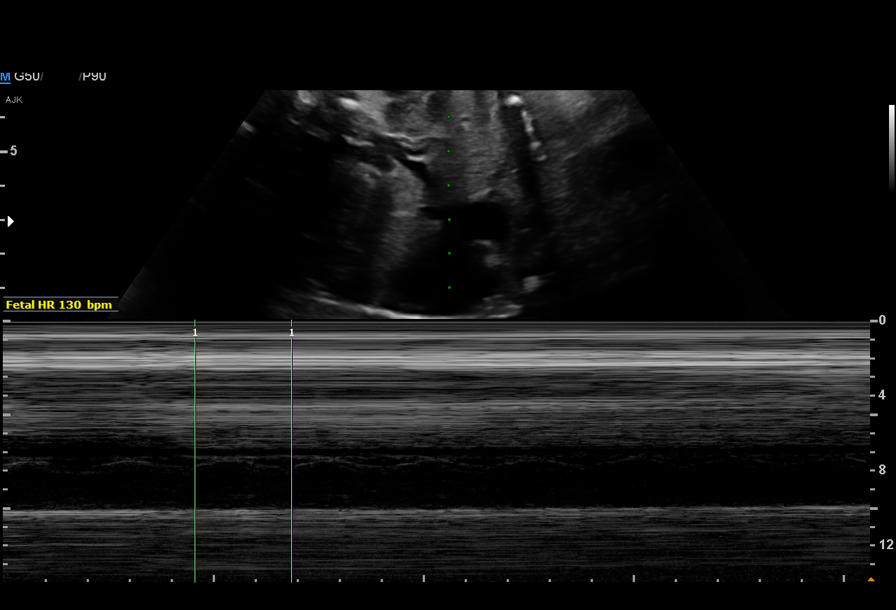
[im 2/15]
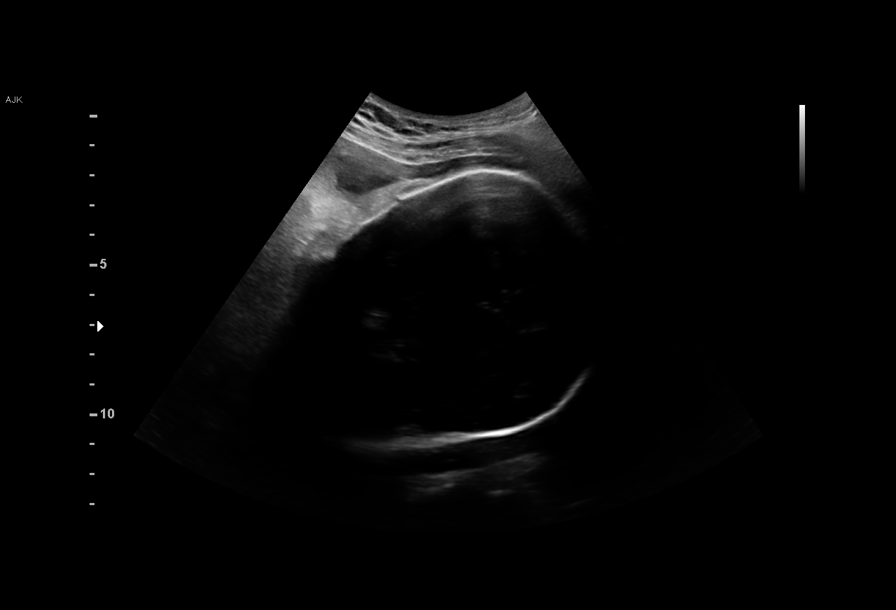
[im 4/15]
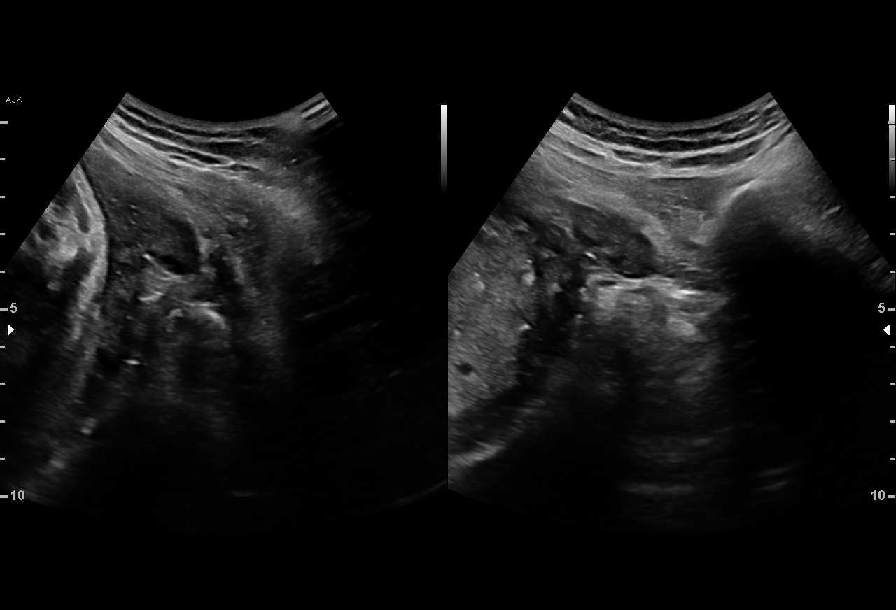
[im 5/15]
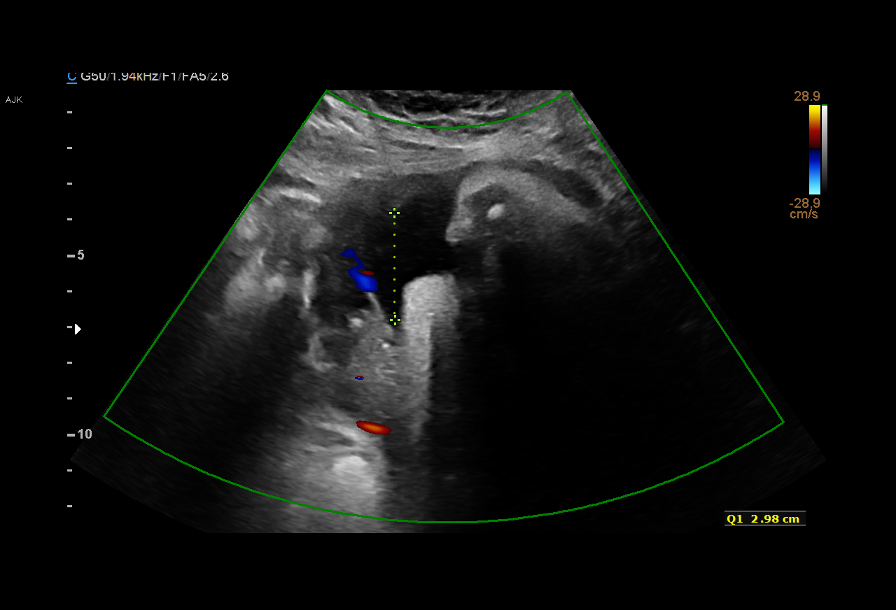
[im 6/15]
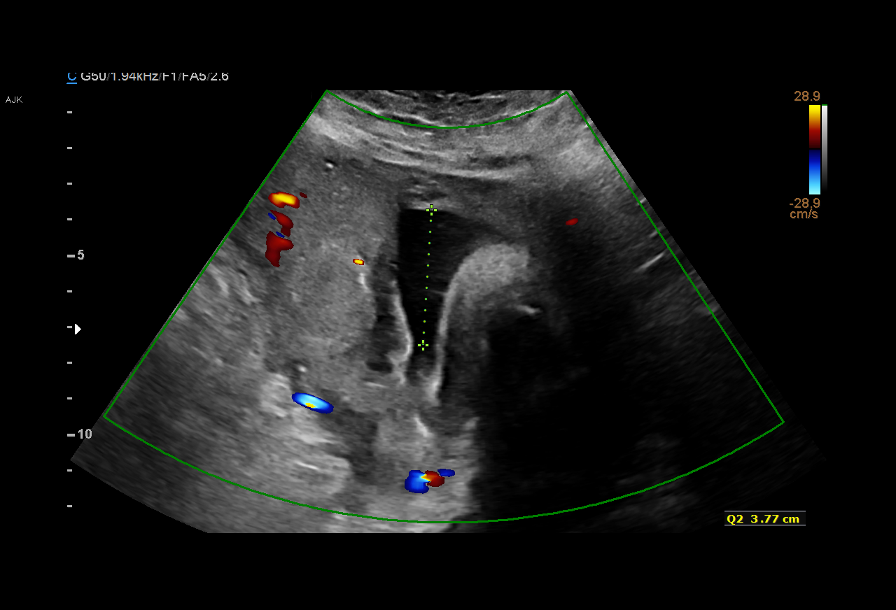
[im 7/15]
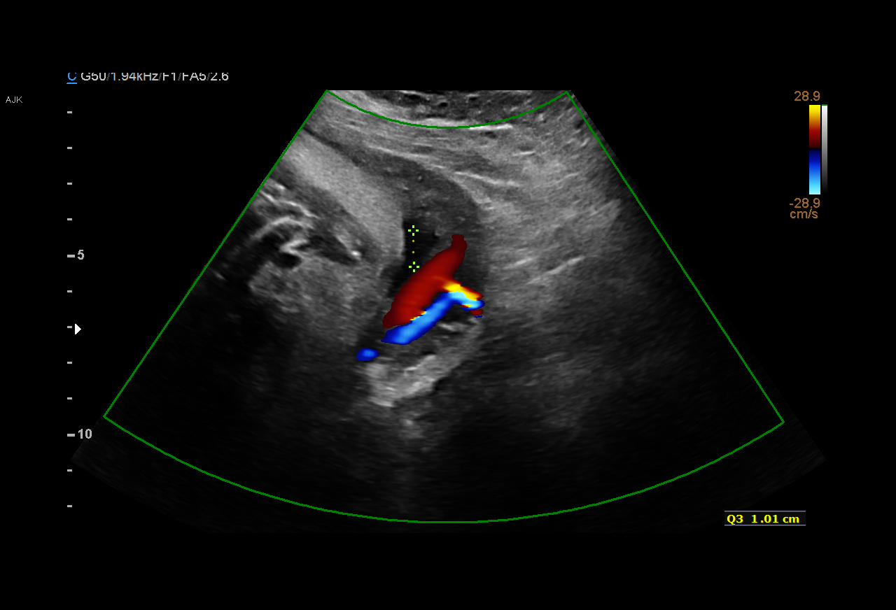
[im 9/15]
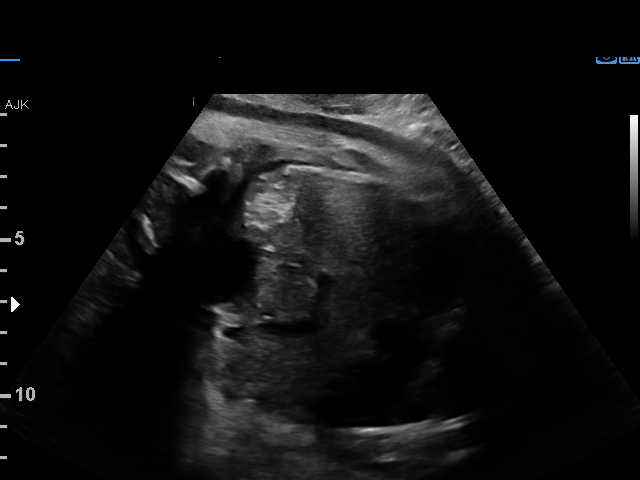
[im 10/15]
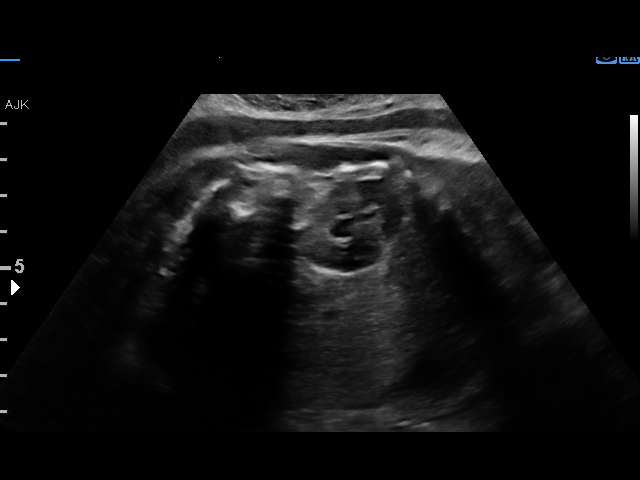
[im 11/15]
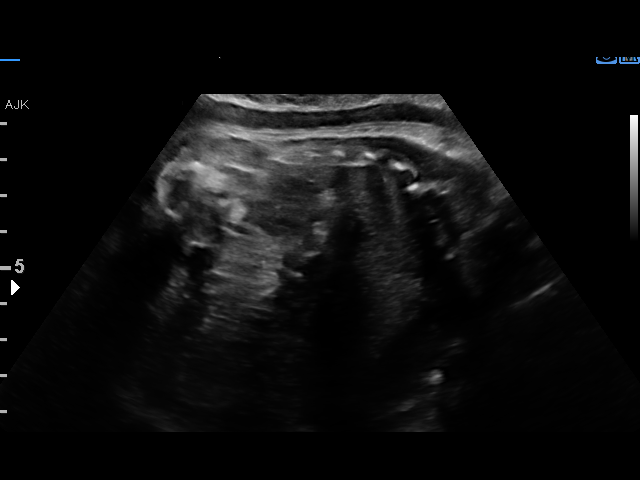
[im 12/15]
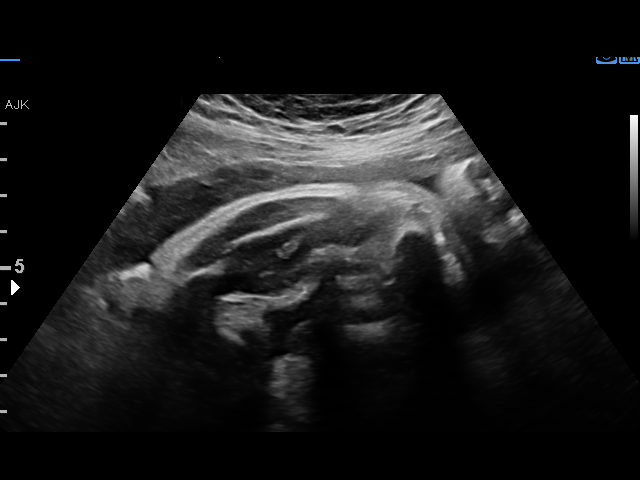
[im 14/15]
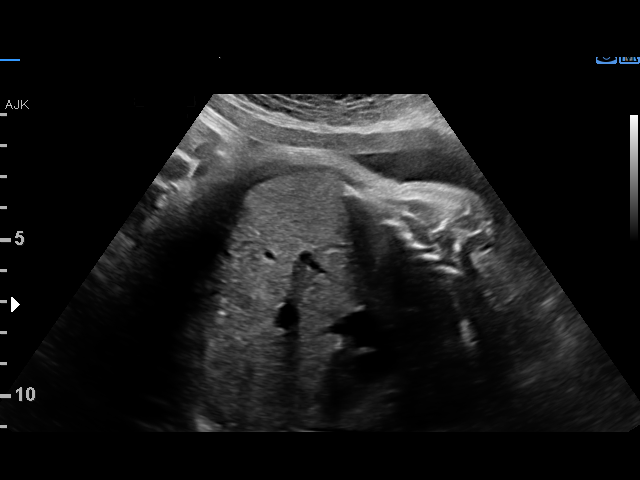
[im 15/15]
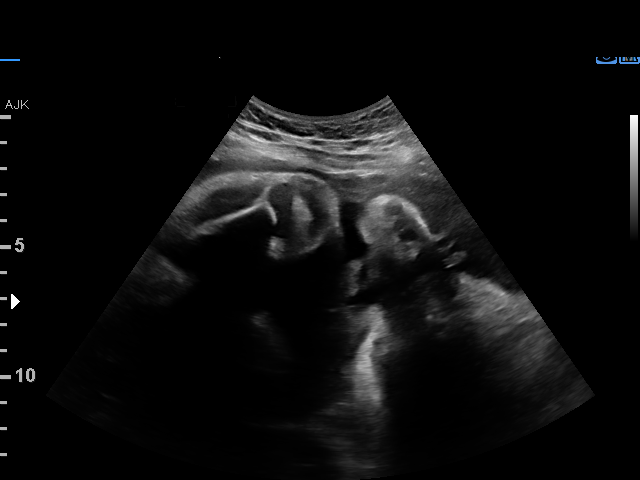

[12 of 15 positions shown; findings below may reference images not displayed]

Road [HOSPITAL]

Indications

38 weeks gestation of pregnancy
Hypertension - Gestational
Drug use complicating pregnancy, third
trimester (+THC)
Small for gestational age fetus affecting
management of mother
Non-reactive NST
OB History

Gravidity:    3
TOP:          2
Fetal Evaluation

Num Of Fetuses:     1
Fetal Heart         130
Rate(bpm):
Cardiac Activity:   Observed
Presentation:       Cephalic
Placenta:           Posterior

Amniotic Fluid
AFI FV:      Subjectively within normal limits

AFI Sum(cm)     %Tile       Largest Pocket(cm)
9.98            26

RUQ(cm)       RLQ(cm)       LUQ(cm)        LLQ(cm)
2.98
Biophysical Evaluation

Amniotic F.V:   Within normal limits       F. Tone:        Observed
F. Movement:    Observed                   Score:          [DATE]
F. Breathing:   Observed
Gestational Age

LMP:           38w 3d       Date:   06/18/16                 EDD:   03/25/17
Best:          38w 3d    Det. By:   LMP  (06/18/16)          EDD:   03/25/17
Cervix Uterus Adnexa

Cervix
Not visualized (advanced GA >81wks)

Uterus
No abnormality visualized.

Left Ovary
Not visualized.

Right Ovary
Not visualized.

Adnexa:       No abnormality visualized.
Impression

IUP at 38+3 weeks with nonreactive NST
Normal fetal cardiac activity
Normal amniotic fluid
BPP [DATE]
Recommendations

Follow-up ultrasounds as clinically indicated.

## 2018-01-09 ENCOUNTER — Other Ambulatory Visit (HOSPITAL_COMMUNITY)
Admission: RE | Admit: 2018-01-09 | Discharge: 2018-01-09 | Disposition: A | Discharge status: Home / Self Care | Payer: Medicaid Other | Source: Ambulatory Visit | Attending: Certified Nurse Midwife | Admitting: Certified Nurse Midwife

## 2018-01-09 ENCOUNTER — Ambulatory Visit: Payer: Medicaid Other | Admitting: Certified Nurse Midwife

## 2018-01-09 ENCOUNTER — Encounter: Payer: Self-pay | Admitting: Certified Nurse Midwife

## 2018-01-09 VITALS — BP 127/88 | HR 76 | Ht 60.0 in | Wt 143.4 lb

## 2018-01-09 DIAGNOSIS — Z01419 Encounter for gynecological examination (general) (routine) without abnormal findings: Secondary | ICD-10-CM | POA: Insufficient documentation

## 2018-01-09 DIAGNOSIS — Z113 Encounter for screening for infections with a predominantly sexual mode of transmission: Secondary | ICD-10-CM

## 2018-01-09 DIAGNOSIS — Z0000 Encounter for general adult medical examination without abnormal findings: Secondary | ICD-10-CM | POA: Diagnosis not present

## 2018-01-09 NOTE — Progress Notes (Signed)
Subjective:        Sarah Collier is a 27 y.o. female here for a routine exam.  Current complaints: none.  Denies change in vaginal discharge.  Desires STD testing, declines blood testing. Reports regular normal periods. Is currently sexually active, declines contraception, is not using condoms.  Same female partner reported.  Is not breast feeding.  Infant will be one year old in August.   Personal health questionnaire:  Is patient [name], have a family history of breast and/or ovarian cancer: no Is there a family history of uterine cancer diagnosed at age < 21, gastrointestinal cancer, urinary tract cancer, family member who is a Personnel officer syndrome-associated carrier: no Is the patient overweight and hypertensive, family history of diabetes, personal history of gestational diabetes, preeclampsia or PCOS: no Is patient over 44, have PCOS,  family history of premature CHD under age 40, diabetes, smoke, have hypertension or peripheral artery disease:  no At any time, has a partner hit, kicked or otherwise hurt or frightened you?: no Over the past 2 weeks, have you felt down, depressed or hopeless?: no Over the past 2 weeks, have you felt little interest or pleasure in doing things?:no   Gynecologic History No LMP recorded. Contraception: none Last Pap: 09/14/17. Results were: normal Last mammogram: n/a <40 years, no significant family history.   Obstetric History OB History  Gravida Para Term Preterm AB Living  3 1 1   2 1   SAB TAB Ectopic Multiple Live Births    2   0 1    # Outcome Date GA Lbr Len/2nd Weight Sex Delivery Anes PTL Lv  3 Term 03/19/17 [redacted]w[redacted]d  5 lb 13.8 oz (2.66 kg) M CS-LTranv EPI  LIV  2 TAB           1 TAB             Past Medical History:  Diagnosis Date  . Infection    gonorrhea, chlamydia  . PID (acute pelvic inflammatory disease)   . Pregnancy induced hypertension     Past Surgical History:  Procedure Laterality Date  . CESAREAN SECTION N/A  03/19/2017   Procedure: CESAREAN SECTION;  Surgeon: Adam Phenix, MD;  Location: [facility] BIRTHING SUITES;  Service: Obstetrics;  Laterality: N/A;  . THERAPEUTIC ABORTION    . WISDOM TOOTH EXTRACTION       Current Outpatient Medications:  .  amLODipine (NORVASC) 10 MG tablet, Take 1 tablet (10 mg total) by mouth daily. (Patient not taking: Reported on 01/09/2018), Disp: 31 tablet, Rfl: 1 .  Elastic Bandages & Supports (COMFORT FIT MATERNITY SUPP LG) MISC, 1 Units by Does not apply route daily. (Patient not taking: Reported on 04/14/2017), Disp: 1 each, Rfl: 0 .  hydrochlorothiazide (HYDRODIURIL) 25 MG tablet, Take 1 tablet (25 mg total) by mouth daily. (Patient not taking: Reported on 01/09/2018), Disp: 30 tablet, Rfl: 5 .  ibuprofen (ADVIL,MOTRIN) 600 MG tablet, Take 1 tablet (600 mg total) by mouth every 6 (six) hours. (Patient not taking: Reported on 04/19/2017), Disp: 30 tablet, Rfl: 0 .  norgestrel-ethinyl estradiol (LO/OVRAL,CRYSELLE) 0.3-30 MG-MCG tablet, Take 1 tablet by mouth daily. Start pills when baby is 26 weeks old. (Patient not taking: Reported on 01/09/2018), Disp: 1 Package, Rfl: 11 .  oxyCODONE (OXY IR/ROXICODONE) 5 MG immediate release tablet, Take 1 tablet (5 mg total) by mouth every 4 (four) hours as needed (pain scale 4-7). (Patient not taking: Reported on 04/06/2017), Disp: 30 tablet, Rfl: 0 .  Prenat-FeAsp-Meth-FA-DHA w/o A (PRENATE PIXIE) 10-0.6-0.4-200 MG CAPS, Take 1 tablet by mouth daily. (Patient not taking: Reported on 01/09/2018), Disp: 30 capsule, Rfl: 12 No Known Allergies  Social History   Tobacco Use  . Smoking status: Former Smoker    Years: 2.00    Types: Cigars    Last attempt to quit: 07/06/2016    Years since quitting: 1.5  . Smokeless tobacco: Never Used  Substance Use Topics  . Alcohol use: No    Comment: Patient reports drinking alcohol every other week, once or twice    Family History  Problem Relation Age of Onset  . Hypertension Mother   . Asthma Father    . Hypertension Father   . Diabetes Maternal Grandmother   . Hearing loss Neg Hx       Review of Systems  Constitutional: negative for fatigue and weight loss Respiratory: negative for cough and wheezing Cardiovascular: negative for chest pain, fatigue and palpitations Gastrointestinal: negative for abdominal pain and change in bowel habits Musculoskeletal:negative for myalgias Neurological: negative for gait problems and tremors Behavioral/Psych: negative for abusive relationship, depression Endocrine: negative for temperature intolerance    Genitourinary:negative for abnormal menstrual periods, genital lesions, hot flashes, sexual problems and vaginal discharge Integument/breast: negative for breast lump, breast tenderness, nipple discharge and skin lesion(s)    Objective:       BP 127/88   Pulse 76   Ht 5' (1.524 m)   Wt 143 lb 6.4 oz (65 kg)   BMI 28.01 kg/m  General:   alert  Skin:   no rash or abnormalities  Lungs:   clear to auscultation bilaterally  Heart:   regular rate and rhythm, S1, S2 normal, no murmur, click, rub or gallop  Breasts:   normal without suspicious masses, skin or nipple changes or axillary nodes  Abdomen:  normal findings: no organomegaly, soft, non-tender and no hernia  Pelvis:  External genitalia: normal general appearance Urinary system: urethral meatus normal and bladder without fullness, nontender Vaginal: normal without tenderness, induration or masses Cervix: normal appearance Adnexa: normal bimanual exam Uterus: anteverted and non-tender, normal size   Lab Review Urine pregnancy test Labs reviewed yes Radiologic studies reviewed no  50% of 30 min visit spent on counseling and coordination of care.   Assessment & Plan    Healthy female exam.    1. Screening examination for STD (sexually transmitted disease)    - Cervicovaginal ancillary only  2. Well woman exam    - Cytology - PAP   Education reviewed: calcium  supplements, depression evaluation, low fat, low cholesterol diet, safe sex/STD prevention, self breast exams, skin cancer screening and weight bearing exercise. Contraception: none. Follow up in: 1 year.    Possible management options include:LARK, OCPs, patch, ring Follow up as needed for contraception if desired.

## 2018-01-09 NOTE — Progress Notes (Signed)
Patient is in the office for annual, last pap 09-14-16. Pt reports little vaginal discharge, desires std testing. Pt declines birth control.

## 2018-01-11 LAB — CERVICOVAGINAL ANCILLARY ONLY
Chlamydia: NEGATIVE
Neisseria Gonorrhea: NEGATIVE
TRICH (WINDOWPATH): NEGATIVE

## 2018-01-12 LAB — CYTOLOGY - PAP: Diagnosis: NEGATIVE

## 2019-07-04 ENCOUNTER — Ambulatory Visit: Payer: Medicaid Other | Admitting: Certified Nurse Midwife

## 2020-01-29 ENCOUNTER — Ambulatory Visit: Payer: Medicaid Other | Admitting: Certified Nurse Midwife

## 2020-04-14 ENCOUNTER — Ambulatory Visit: Payer: Medicaid Other | Admitting: Family Medicine

## 2020-04-14 ENCOUNTER — Encounter: Payer: Self-pay | Admitting: Family Medicine

## 2020-04-14 NOTE — Progress Notes (Signed)
Patient did not keep appointment today. She may call to reschedule.  

## 2020-12-10 ENCOUNTER — Other Ambulatory Visit: Payer: Self-pay

## 2020-12-10 ENCOUNTER — Encounter: Payer: Self-pay | Admitting: Obstetrics

## 2020-12-10 ENCOUNTER — Other Ambulatory Visit (HOSPITAL_COMMUNITY)
Admission: RE | Admit: 2020-12-10 | Discharge: 2020-12-10 | Disposition: A | Discharge status: Home / Self Care | Payer: Medicaid Other | Source: Ambulatory Visit | Attending: Obstetrics | Admitting: Obstetrics

## 2020-12-10 ENCOUNTER — Ambulatory Visit (INDEPENDENT_AMBULATORY_CARE_PROVIDER_SITE_OTHER): Payer: Medicaid Other | Admitting: Obstetrics

## 2020-12-10 VITALS — BP 130/89 | HR 78 | Ht 60.0 in | Wt 127.0 lb

## 2020-12-10 DIAGNOSIS — F17200 Nicotine dependence, unspecified, uncomplicated: Secondary | ICD-10-CM

## 2020-12-10 DIAGNOSIS — Z01419 Encounter for gynecological examination (general) (routine) without abnormal findings: Secondary | ICD-10-CM | POA: Insufficient documentation

## 2020-12-10 DIAGNOSIS — Z30016 Encounter for initial prescription of transdermal patch hormonal contraceptive device: Secondary | ICD-10-CM

## 2020-12-10 DIAGNOSIS — N898 Other specified noninflammatory disorders of vagina: Secondary | ICD-10-CM | POA: Diagnosis not present

## 2020-12-10 DIAGNOSIS — Z3009 Encounter for other general counseling and advice on contraception: Secondary | ICD-10-CM

## 2020-12-10 DIAGNOSIS — N946 Dysmenorrhea, unspecified: Secondary | ICD-10-CM | POA: Diagnosis not present

## 2020-12-10 MED ORDER — IBUPROFEN 800 MG PO TABS: 800.0000 mg | ORAL_TABLET | Freq: Three times a day (TID) | ORAL | 5 refills | Status: DC | PRN

## 2020-12-10 MED ORDER — XULANE 150-35 MCG/24HR TD PTWK: 1.0000 | MEDICATED_PATCH | TRANSDERMAL | 12 refills | Status: DC

## 2020-12-10 NOTE — Progress Notes (Signed)
Subjective:        Sarah Collier is a 30 y.o. female here for a routine exam.  Current complaints: Vaginal discharge, and painful, heavy periods.  Personal health questionnaire:  Is patient Sarah Collier, have a family history of breast and/or ovarian cancer: no Is there a family history of uterine cancer diagnosed at age < 22, gastrointestinal cancer, urinary tract cancer, family member who is a Personnel officer syndrome-associated carrier: no Is the patient overweight and hypertensive, family history of diabetes, personal history of gestational diabetes, preeclampsia or PCOS: no Is patient over 37, have PCOS,  family history of premature CHD under age 77, diabetes, smoke, have hypertension or peripheral artery disease:  no At any time, has a partner hit, kicked or otherwise hurt or frightened you?: no Over the past 2 weeks, have you felt down, depressed or hopeless?: no Over the past 2 weeks, have you felt little interest or pleasure in doing things?:no   Gynecologic History Patient's last menstrual period was 11/27/2020. Contraception: none Last Pap: 2019. Results were: normal Last mammogram: n/a. Results were: n/a  Obstetric History OB History  Gravida Para Term Preterm AB Living  3 1 1   2 1   SAB IAB Ectopic Multiple Live Births    2   0 1    # Outcome Date GA Lbr Len/2nd Weight Sex Delivery Anes PTL Lv  3 Term 03/19/17 [redacted]w[redacted]d  5 lb 13.8 oz (2.66 kg) M CS-LTranv EPI  LIV  2 IAB           1 IAB             Past Medical History:  Diagnosis Date  . Infection    gonorrhea, chlamydia  . PID (acute pelvic inflammatory disease)   . Pregnancy induced hypertension     Past Surgical History:  Procedure Laterality Date  . CESAREAN SECTION N/A 03/19/2017   Procedure: CESAREAN SECTION;  Surgeon: 03/21/2017, MD;  Location: [facility] BIRTHING SUITES;  Service: Obstetrics;  Laterality: N/A;  . THERAPEUTIC ABORTION    . WISDOM TOOTH EXTRACTION       Current Outpatient Medications:   .  ibuprofen (ADVIL,MOTRIN) 600 MG tablet, Take 1 tablet (600 mg total) by mouth every 6 (six) hours. (Patient not taking: Reported on 04/19/2017), Disp: 30 tablet, Rfl: 0 .  norgestrel-ethinyl estradiol (LO/OVRAL,CRYSELLE) 0.3-30 MG-MCG tablet, Take 1 tablet by mouth daily. Start pills when baby is 36 weeks old. (Patient not taking: Reported on 01/09/2018), Disp: 1 Package, Rfl: 11 No Known Allergies  Social History   Tobacco Use  . Smoking status: Current Every Day Smoker    Years: 2.00    Types: Cigars    Last attempt to quit: 07/06/2016    Years since quitting: 4.4  . Smokeless tobacco: Never Used  Substance Use Topics  . Alcohol use: No    Comment: Patient reports drinking alcohol every other week, once or twice    Family History  Problem Relation Age of Onset  . Hypertension Mother   . Asthma Father   . Hypertension Father   . Diabetes Maternal Grandmother   . Hearing loss Neg Hx       Review of Systems  Constitutional: negative for fatigue and weight loss Respiratory: negative for cough and wheezing Cardiovascular: negative for chest pain, fatigue and palpitations Gastrointestinal: negative for abdominal pain and change in bowel habits Musculoskeletal:negative for myalgias Neurological: negative for gait problems and tremors Behavioral/Psych: negative for abusive relationship, depression  Endocrine: negative for temperature intolerance    Genitourinary: positive for vaginal discharge and heavy, painful periods. negative for abnormal menstrual periods, genital lesions, hot flashes, sexual problems  Integument/breast: negative for breast lump, breast tenderness, nipple discharge and skin lesion(s)    Objective:       BP 130/89   Pulse 78   Ht 5' (1.524 m)   Wt 127 lb (57.6 kg)   LMP 11/27/2020   BMI 24.80 kg/m  General:   alert and no distress  Skin:   no rash or abnormalities  Lungs:   clear to auscultation bilaterally  Heart:   regular rate and rhythm, S1, S2  normal, no murmur, click, rub or gallop  Breasts:   normal without suspicious masses, skin or nipple changes or axillary nodes  Abdomen:  normal findings: no organomegaly, soft, non-tender and no hernia  Pelvis:  External genitalia: normal general appearance Urinary system: urethral meatus normal and bladder without fullness, nontender Vaginal: normal without tenderness, induration or masses Cervix: normal appearance Adnexa: normal bimanual exam Uterus: anteverted and non-tender, normal size   Lab Review Urine pregnancy test Labs reviewed yes Radiologic studies reviewed no  I have spent a total of 20 minutes of face-to-face time, excluding clinical staff time, reviewing notes and preparing to see patient, ordering tests and/or medications, and counseling the patient.  Assessment:     1. Encounter for routine gynecological examination with Papanicolaou smear of cervix Rx:  - Cytology - PAP( [place])  2. Vaginal discharge Rx: - Cervicovaginal ancillary only( [place])  3. Dysmenorrhea Rx: - ibuprofen (ADVIL) 800 MG tablet; Take 1 tablet (800 mg total) by mouth every 8 (eight) hours as needed.  Dispense: 30 tablet; Refill: 5  4. Tobacco dependence - cessation with the aid of medication and behavioral modification recommended  5. Encounter for counseling regarding contraception - discussed options  - wants the patch   6. Encounter for initial prescription of transdermal patch hormonal contraceptive device Rx: - norelgestromin-ethinyl estradiol [employer]) 150-35 MCG/24HR transdermal patch; Place 1 patch onto the skin once a week.  Dispense: 3 patch; Refill: 12    Plan:    Education reviewed: calcium supplements, depression evaluation, low fat, low cholesterol diet, safe sex/STD prevention, self breast exams, smoking cessation and weight bearing exercise. Contraception: Financial risk analyst. Follow up in: 4 months.   No orders of the defined types were placed in this  encounter.  No orders of the defined types were placed in this encounter.   Brock Bad, MD 12/10/2020 2:15 PM

## 2020-12-11 LAB — CERVICOVAGINAL ANCILLARY ONLY
Bacterial Vaginitis (gardnerella): POSITIVE — AB
Candida Glabrata: NEGATIVE
Candida Vaginitis: POSITIVE — AB
Chlamydia: NEGATIVE
Comment: NEGATIVE
Comment: NEGATIVE
Comment: NEGATIVE
Comment: NEGATIVE
Comment: NEGATIVE
Comment: NORMAL
Neisseria Gonorrhea: NEGATIVE
Trichomonas: NEGATIVE

## 2020-12-14 ENCOUNTER — Other Ambulatory Visit: Payer: Self-pay | Admitting: Obstetrics

## 2020-12-14 DIAGNOSIS — B373 Candidiasis of vulva and vagina: Secondary | ICD-10-CM

## 2020-12-14 DIAGNOSIS — B3731 Acute candidiasis of vulva and vagina: Secondary | ICD-10-CM

## 2020-12-14 DIAGNOSIS — N760 Acute vaginitis: Secondary | ICD-10-CM

## 2020-12-14 DIAGNOSIS — B9689 Other specified bacterial agents as the cause of diseases classified elsewhere: Secondary | ICD-10-CM

## 2020-12-14 MED ORDER — FLUCONAZOLE 150 MG PO TABS: 150.0000 mg | ORAL_TABLET | Freq: Once | ORAL | 0 refills | Status: DC

## 2020-12-14 MED ORDER — METRONIDAZOLE 500 MG PO TABS: 500.0000 mg | ORAL_TABLET | Freq: Two times a day (BID) | ORAL | 2 refills | Status: DC

## 2020-12-15 ENCOUNTER — Other Ambulatory Visit: Payer: Self-pay | Admitting: Obstetrics

## 2020-12-15 LAB — CYTOLOGY - PAP
Comment: NEGATIVE
Diagnosis: UNDETERMINED — AB
High risk HPV: NEGATIVE

## 2021-01-19 ENCOUNTER — Other Ambulatory Visit: Payer: Self-pay | Admitting: *Deleted

## 2021-01-19 DIAGNOSIS — B373 Candidiasis of vulva and vagina: Secondary | ICD-10-CM

## 2021-01-19 DIAGNOSIS — B3731 Acute candidiasis of vulva and vagina: Secondary | ICD-10-CM

## 2021-01-19 MED ORDER — FLUCONAZOLE 150 MG PO TABS: 150.0000 mg | ORAL_TABLET | Freq: Once | ORAL | 0 refills | Status: AC

## 2021-01-19 NOTE — Progress Notes (Signed)
Pt called to office with symptoms of yeast - thick white clumpy d/c with vaginal itching/irritation.  Diflucan sent today.  Pt advised to call office if no relief with Diflucan use.

## 2021-03-12 ENCOUNTER — Other Ambulatory Visit: Payer: Self-pay

## 2021-03-12 ENCOUNTER — Ambulatory Visit
Admission: RE | Admit: 2021-03-12 | Discharge: 2021-03-12 | Disposition: A | Discharge status: Home / Self Care | Payer: No Typology Code available for payment source | Source: Ambulatory Visit | Attending: Obstetrics and Gynecology | Admitting: Obstetrics and Gynecology

## 2021-03-12 ENCOUNTER — Other Ambulatory Visit: Payer: Self-pay | Admitting: Obstetrics and Gynecology

## 2021-03-12 DIAGNOSIS — Z111 Encounter for screening for respiratory tuberculosis: Secondary | ICD-10-CM

## 2022-02-21 ENCOUNTER — Emergency Department (HOSPITAL_BASED_OUTPATIENT_CLINIC_OR_DEPARTMENT_OTHER): Payer: Medicaid Other | Admitting: Radiology

## 2022-02-21 ENCOUNTER — Emergency Department (HOSPITAL_BASED_OUTPATIENT_CLINIC_OR_DEPARTMENT_OTHER)
Admission: EM | Admit: 2022-02-21 | Discharge: 2022-02-21 | Disposition: A | Discharge status: Home / Self Care | Payer: Medicaid Other | Attending: Emergency Medicine | Admitting: Emergency Medicine

## 2022-02-21 ENCOUNTER — Other Ambulatory Visit: Payer: Self-pay

## 2022-02-21 ENCOUNTER — Encounter (HOSPITAL_BASED_OUTPATIENT_CLINIC_OR_DEPARTMENT_OTHER): Payer: Self-pay

## 2022-02-21 DIAGNOSIS — I10 Essential (primary) hypertension: Secondary | ICD-10-CM | POA: Diagnosis not present

## 2022-02-21 DIAGNOSIS — R630 Anorexia: Secondary | ICD-10-CM | POA: Diagnosis not present

## 2022-02-21 DIAGNOSIS — R531 Weakness: Secondary | ICD-10-CM | POA: Diagnosis not present

## 2022-02-21 DIAGNOSIS — D72828 Other elevated white blood cell count: Secondary | ICD-10-CM | POA: Insufficient documentation

## 2022-02-21 DIAGNOSIS — F17200 Nicotine dependence, unspecified, uncomplicated: Secondary | ICD-10-CM | POA: Insufficient documentation

## 2022-02-21 DIAGNOSIS — R0602 Shortness of breath: Secondary | ICD-10-CM | POA: Diagnosis present

## 2022-02-21 DIAGNOSIS — R000 Tachycardia, unspecified: Secondary | ICD-10-CM | POA: Diagnosis not present

## 2022-02-21 DIAGNOSIS — J209 Acute bronchitis, unspecified: Secondary | ICD-10-CM | POA: Insufficient documentation

## 2022-02-21 DIAGNOSIS — E8729 Other acidosis: Secondary | ICD-10-CM | POA: Diagnosis not present

## 2022-02-21 DIAGNOSIS — Z20822 Contact with and (suspected) exposure to covid-19: Secondary | ICD-10-CM | POA: Diagnosis not present

## 2022-02-21 LAB — URINALYSIS, ROUTINE W REFLEX MICROSCOPIC
Bilirubin Urine: NEGATIVE
Glucose, UA: NEGATIVE mg/dL
Hgb urine dipstick: NEGATIVE
Ketones, ur: NEGATIVE mg/dL
Leukocytes,Ua: NEGATIVE
Nitrite: NEGATIVE
Protein, ur: 30 mg/dL — AB
Specific Gravity, Urine: 1.027 (ref 1.005–1.030)
pH: 7.5 (ref 5.0–8.0)

## 2022-02-21 LAB — CBC WITH DIFFERENTIAL/PLATELET
Abs Immature Granulocytes: 0.03 10*3/uL (ref 0.00–0.07)
Basophils Absolute: 0 10*3/uL (ref 0.0–0.1)
Basophils Relative: 0 %
Eosinophils Absolute: 0.1 10*3/uL (ref 0.0–0.5)
Eosinophils Relative: 1 %
HCT: 40.2 % (ref 36.0–46.0)
Hemoglobin: 14 g/dL (ref 12.0–15.0)
Immature Granulocytes: 0 %
Lymphocytes Relative: 13 %
Lymphs Abs: 1.4 10*3/uL (ref 0.7–4.0)
MCH: 31.5 pg (ref 26.0–34.0)
MCHC: 34.8 g/dL (ref 30.0–36.0)
MCV: 90.3 fL (ref 80.0–100.0)
Monocytes Absolute: 0.9 10*3/uL (ref 0.1–1.0)
Monocytes Relative: 8 %
Neutro Abs: 8.4 10*3/uL — ABNORMAL HIGH (ref 1.7–7.7)
Neutrophils Relative %: 78 %
Platelets: 331 10*3/uL (ref 150–400)
RBC: 4.45 MIL/uL (ref 3.87–5.11)
RDW: 11.9 % (ref 11.5–15.5)
WBC: 10.9 10*3/uL — ABNORMAL HIGH (ref 4.0–10.5)
nRBC: 0 % (ref 0.0–0.2)

## 2022-02-21 LAB — COMPREHENSIVE METABOLIC PANEL
ALT: 11 U/L (ref 0–44)
AST: 15 U/L (ref 15–41)
Albumin: 5 g/dL (ref 3.5–5.0)
Alkaline Phosphatase: 45 U/L (ref 38–126)
Anion gap: 16 — ABNORMAL HIGH (ref 5–15)
BUN: 11 mg/dL (ref 6–20)
CO2: 22 mmol/L (ref 22–32)
Calcium: 10.2 mg/dL (ref 8.9–10.3)
Chloride: 102 mmol/L (ref 98–111)
Creatinine, Ser: 0.83 mg/dL (ref 0.44–1.00)
GFR, Estimated: >60 mL/min (ref >60)
Glucose, Bld: 91 mg/dL (ref 70–99)
Potassium: 3.5 mmol/L (ref 3.5–5.1)
Sodium: 140 mmol/L (ref 135–145)
Total Bilirubin: 0.6 mg/dL (ref 0.3–1.2)
Total Protein: 8.3 g/dL — ABNORMAL HIGH (ref 6.5–8.1)

## 2022-02-21 LAB — RESP PANEL BY RT-PCR (FLU A&B, COVID) ARPGX2
Influenza A by PCR: NEGATIVE
Influenza B by PCR: NEGATIVE
SARS Coronavirus 2 by RT PCR: NEGATIVE

## 2022-02-21 LAB — GROUP A STREP BY PCR: Group A Strep by PCR: NOT DETECTED

## 2022-02-21 LAB — PREGNANCY, URINE: Preg Test, Ur: NEGATIVE

## 2022-02-21 MED ORDER — IPRATROPIUM-ALBUTEROL 0.5-2.5 (3) MG/3ML IN SOLN
3.0000 mL | Freq: Once | RESPIRATORY_TRACT | Status: AC
  Administered 2022-02-21: 3 mL via RESPIRATORY_TRACT
  Filled 2022-02-21: qty 3

## 2022-02-21 MED ORDER — IBUPROFEN 800 MG PO TABS
800.0000 mg | ORAL_TABLET | Freq: Once | ORAL | Status: AC
Start: 2022-02-21 — End: 2022-02-21
  Administered 2022-02-21: 800 mg via ORAL
  Filled 2022-02-21: qty 1

## 2022-02-21 MED ORDER — ALBUTEROL SULFATE HFA 108 (90 BASE) MCG/ACT IN AERS: 1.0000 | INHALATION_SPRAY | Freq: Four times a day (QID) | RESPIRATORY_TRACT | 0 refills | Status: DC | PRN

## 2022-02-21 MED ORDER — SODIUM CHLORIDE 0.9 % IV BOLUS
1000.0000 mL | Freq: Once | INTRAVENOUS | Status: AC
  Administered 2022-02-21: 1000 mL via INTRAVENOUS

## 2022-02-21 MED ORDER — BENZONATATE 200 MG PO CAPS: 200.0000 mg | ORAL_CAPSULE | Freq: Three times a day (TID) | ORAL | 0 refills | Status: AC

## 2022-02-21 NOTE — ED Triage Notes (Signed)
Patient here BIB [facility] from Home.  Endorses Multiple Symptoms including Dry Cough, SOB, Sore Throat, Chills.  No Known Fevers. No Known Respiratory History. VSS with [facility].  NAD Noted during Triage. A&Ox4. GCS 15. BIB Wheelchair/Stretcher.

## 2022-02-21 NOTE — ED Notes (Signed)
Family at bedside. 

## 2022-02-21 NOTE — Discharge Instructions (Addendum)
Use inhaler as needed as prescribed for cough.  Can also take Tessalon for cough. Follow-up with your primary care provider for recheck and discuss blood pressure management.

## 2022-02-21 NOTE — ED Provider Notes (Signed)
[facility] EMERGENCY DEPT Provider Note   CSN: [id number] Arrival date & time: 02/21/22  1414     History  Chief Complaint  Patient presents with   Shortness of Breath    Sarah Collier is a 31 y.o. female.  31 year old female with no significant past medical history, brought in by EMS for [facility]. Onset yesterday, dry cough, pain worse with cough/breathing. Tried DayQuil without relief. Associated sore throat, chills, nausea, fatigue, loss of appetite, generalized weakness.  Similar episode last month, did not seek treatment at that time.  Works as a Lawyer, no known sick contacts.  Daily smoker.        Home Medications Prior to Admission medications   Medication Sig Start Date End Date Taking? Authorizing Provider  albuterol (VENTOLIN HFA) 108 (90 Base) MCG/ACT inhaler Inhale 1-2 puffs into the lungs every 6 (six) hours as needed for wheezing or shortness of breath. 02/21/22  Yes Jeannie Fend, PA-C  benzonatate (TESSALON) 200 MG capsule Take 1 capsule (200 mg total) by mouth every 8 (eight) hours for 10 days. 02/21/22 03/03/22 Yes Jeannie Fend, PA-C  ibuprofen (ADVIL) 800 MG tablet Take 1 tablet (800 mg total) by mouth every 8 (eight) hours as needed. 12/10/20   Brock Bad, MD  ibuprofen (ADVIL,MOTRIN) 600 MG tablet Take 1 tablet (600 mg total) by mouth every 6 (six) hours. Patient not taking: Reported on 04/19/2017 03/21/17   Allie Bossier, MD  metroNIDAZOLE (FLAGYL) 500 MG tablet Take 1 tablet (500 mg total) by mouth 2 (two) times daily. 12/14/20   Brock Bad, MD  norelgestromin-ethinyl estradiol [employer]) 150-35 MCG/24HR transdermal patch Place 1 patch onto the skin once a week. 12/10/20   Brock Bad, MD      Allergies    Patient has no known allergies.    Review of Systems   Review of Systems Negative except as per HPI Physical Exam Updated Vital Signs BP (!) 148/104   Pulse 94   Temp 98.2 F (36.8 C) (Oral)   Resp (!) 23   Ht 5'  (1.524 m)   Wt 57.6 kg   LMP 02/20/2022 (Exact Date)   SpO2 100%   BMI 24.80 kg/m  Physical Exam Vitals and nursing note reviewed.  Constitutional:      General: She is not in acute distress.    Appearance: She is well-developed. She is not diaphoretic.     Comments: Appears to feel unwell, frequent dry cough  HENT:     Head: Normocephalic and atraumatic.  Cardiovascular:     Rate and Rhythm: Regular rhythm. Tachycardia present.  Pulmonary:     Effort: Pulmonary effort is normal.     Breath sounds: Normal breath sounds. No decreased breath sounds or wheezing.  Chest:     Chest wall: Tenderness present.    Abdominal:     Palpations: Abdomen is soft.     Tenderness: There is no abdominal tenderness.  Musculoskeletal:     Cervical back: Neck supple.     Right lower leg: No edema.     Left lower leg: No edema.  Skin:    General: Skin is warm and dry.     Findings: No erythema or rash.  Neurological:     Mental Status: She is alert and oriented to person, place, and time.  Psychiatric:        Behavior: Behavior normal.     ED Results / Procedures / Treatments  Labs (all labs ordered are listed, but only abnormal results are displayed) Labs Reviewed  CBC WITH DIFFERENTIAL/PLATELET - Abnormal; Notable for the following components:      Result Value   WBC 10.9 (*)    Neutro Abs 8.4 (*)    All other components within normal limits  COMPREHENSIVE METABOLIC PANEL - Abnormal; Notable for the following components:   Total Protein 8.3 (*)    Anion gap 16 (*)    All other components within normal limits  URINALYSIS, ROUTINE W REFLEX MICROSCOPIC - Abnormal; Notable for the following components:   Protein, ur 30 (*)    All other components within normal limits  GROUP A STREP BY PCR  RESP PANEL BY RT-PCR (FLU A&B, COVID) [id number]  PREGNANCY, URINE    EKG None  Radiology DG Chest 2 View  Result Date: 02/21/2022 CLINICAL DATA:  Shortness of breath EXAM: CHEST - 2 VIEW  COMPARISON:  03/12/2021 FINDINGS: The heart size and mediastinal contours are within normal limits. Both lungs are clear. The visualized skeletal structures are unremarkable. IMPRESSION: No active cardiopulmonary disease. Electronically Signed   By: Jasmine Pang M.D.   On: 02/21/2022 15:41    Procedures Procedures    Medications Ordered in ED Medications  sodium chloride 0.9 % bolus 1,000 mL ( Intravenous Stopped 02/21/22 1835)  ipratropium-albuterol (DUONEB) 0.5-2.5 (3) MG/3ML nebulizer solution 3 mL (3 mLs Nebulization Given 02/21/22 1727)  ibuprofen (ADVIL) tablet 800 mg (800 mg Oral Given 02/21/22 1730)    ED Course/ Medical Decision Making/ A&P                           Medical Decision Making Amount and/or Complexity of Data Reviewed Labs: ordered.  Risk Prescription drug management.   This patient presents to the ED for concern of cough, congestion, sore throat, this involves an extensive number of treatment options, and is a complaint that carries with it a high risk of complications and morbidity.  The differential diagnosis includes but not limited to viral URI, strep, pneumonia, bronchitis    Co morbidities that complicate the patient evaluation  Hypertension, smoker   Additional history obtained:  External records from outside source obtained and reviewed including visit to PCP on 12/10/2020 where her blood pressure was noted to be 130/89.   Lab Tests:  I Ordered, and personally interpreted labs.  The pertinent results include: CBC with mild acidosis white count 10.9, slight increase in neutrophils.  COVID/flu/strep negative.  CMP without significant findings.  Urinalysis with small amount of protein.  hCG negative.   Imaging Studies ordered:  I ordered imaging studies including chest x-ray I independently visualized and interpreted imaging which showed no acute abnormality I agree with the radiologist interpretation   Cardiac Monitoring: / EKG:  The  patient was maintained on a cardiac monitor.  I personally viewed and interpreted the cardiac monitored which showed an underlying rhythm of: sinus tach rate 116  Problem List / ED Course / Critical interventions / Medication management  31 year old female with complaint of cough, congestion, sore throat as above.  She is found to be mildly tachypneic, upper airway sounds without wheezing found in lungs.  Does have chest wall tenderness, palpation of her chest reproduces her chest pain complaint.  Her work-up today is largely reassuring.  Symptoms improved with IV fluids, DuoNeb, Motrin.  Patient is discharged with albuterol inhaler and Tessalon.  Discussed importance of following up with her primary care  provider to I have reviewed the patients home medicines and have made adjustments as needed   Social Determinants of Health:  Has PCP   Test / Admission - Considered:  CT PE study considered however symptoms improved with nebulizer treatment and history consistent with viral URI versus bronchitis.  Heart rate elevated on arrival, improved with IV fluids.         Final Clinical Impression(s) / ED Diagnoses Final diagnoses:  Acute bronchitis, unspecified organism  Hypertension, unspecified type    Rx / DC Orders ED Discharge Orders          Ordered    albuterol (VENTOLIN HFA) 108 (90 Base) MCG/ACT inhaler  Every 6 hours PRN        02/21/22 1953    benzonatate (TESSALON) 200 MG capsule  Every 8 hours        02/21/22 1953              Jeannie Fend, PA-C 02/21/22 2042    Rolan Bucco, MD 02/21/22 2317

## 2022-04-05 ENCOUNTER — Ambulatory Visit: Payer: Medicaid Other | Admitting: Obstetrics and Gynecology

## 2022-05-13 ENCOUNTER — Encounter (INDEPENDENT_AMBULATORY_CARE_PROVIDER_SITE_OTHER): Payer: Self-pay

## 2022-05-13 ENCOUNTER — Ambulatory Visit (INDEPENDENT_AMBULATORY_CARE_PROVIDER_SITE_OTHER): Payer: Medicaid Other | Admitting: Primary Care

## 2022-05-26 ENCOUNTER — Telehealth: Payer: Self-pay

## 2022-05-31 ENCOUNTER — Other Ambulatory Visit (HOSPITAL_COMMUNITY)
Admission: RE | Admit: 2022-05-31 | Discharge: 2022-05-31 | Disposition: A | Discharge status: Home / Self Care | Payer: Medicaid Other | Source: Ambulatory Visit | Attending: Obstetrics and Gynecology | Admitting: Obstetrics and Gynecology

## 2022-05-31 ENCOUNTER — Encounter: Payer: Self-pay | Admitting: Obstetrics and Gynecology

## 2022-05-31 ENCOUNTER — Ambulatory Visit (INDEPENDENT_AMBULATORY_CARE_PROVIDER_SITE_OTHER): Payer: Medicaid Other | Admitting: Obstetrics and Gynecology

## 2022-05-31 VITALS — BP 117/81 | HR 77 | Ht 59.0 in | Wt 130.0 lb

## 2022-05-31 DIAGNOSIS — Z01419 Encounter for gynecological examination (general) (routine) without abnormal findings: Secondary | ICD-10-CM | POA: Diagnosis present

## 2022-05-31 NOTE — Progress Notes (Signed)
Subjective:     Sarah Collier is a 31 y.o. female P1 with LMP 05/14/22 and BMI 26 who is here for a comprehensive physical exam. The patient reports no problems. She reports a monthly period lasting 5-7 days. She denies pelvic pain or abnormal discharge. She is sexually active without contraception and would welcome a pregnancy if it occurred. Patient denies any urinary symptoms or constipation  Past Medical History:  Diagnosis Date   Infection    gonorrhea, chlamydia   PID (acute pelvic inflammatory disease)    Pregnancy induced hypertension    Past Surgical History:  Procedure Laterality Date   CESAREAN SECTION N/A 03/19/2017   Procedure: CESAREAN SECTION;  Surgeon: Woodroe Mode, MD;  Location: [place];  Service: Obstetrics;  Laterality: N/A;   THERAPEUTIC ABORTION     WISDOM TOOTH EXTRACTION     Family History  Problem Relation Age of Onset   Hypertension Mother    Asthma Father    Hypertension Father    Diabetes Maternal Grandmother    Hearing loss Neg Hx     Social History   Socioeconomic History   Marital status: Single    Spouse name: Not on file   Number of children: Not on file   Years of education: Not on file   Highest education level: Not on file  Occupational History   Not on file  Tobacco Use   Smoking status: Every Day    Types: Cigars    Last attempt to quit: 07/06/2016    Years since quitting: 5.9   Smokeless tobacco: Never  Vaping Use   Vaping Use: Never used  Substance and Sexual Activity   Alcohol use: No    Comment: Patient reports drinking alcohol every other week, once or twice   Drug use: No   Sexual activity: Yes    Birth control/protection: None  Other Topics Concern   Not on file  Social History Narrative   Not on file   Social Determinants of Health   Financial Resource Strain: Not on file  Food Insecurity: Not on file  Transportation Needs: Not on file  Physical Activity: Not on file  Stress: Not on file   Social Connections: Not on file  Intimate Partner Violence: Not on file   Health Maintenance  Topic Date Due   COVID-19 Vaccine (1) Never done   Hepatitis C Screening  Never done   INFLUENZA VACCINE  Never done   PAP SMEAR-Modifier  12/11/2023   TETANUS/TDAP  03/01/2027   HIV Screening  Completed   HPV VACCINES  Aged Out       Review of Systems Pertinent items noted in HPI and remainder of comprehensive ROS otherwise negative.   Objective:  Blood pressure 117/81, pulse 77, height 4\' 11"  (1.499 m), weight 130 lb (59 kg), last menstrual period 05/14/2022, unknown if currently breastfeeding.   GENERAL: Well-developed, well-nourished female in no acute distress.  HEENT: Normocephalic, atraumatic. Sclerae anicteric.  NECK: Supple. Normal thyroid.  LUNGS: Clear to auscultation bilaterally.  HEART: Regular rate and rhythm. BREASTS: Symmetric in size. No palpable masses or lymphadenopathy, skin changes, or nipple drainage. ABDOMEN: Soft, nontender, nondistended. No organomegaly. PELVIC: Normal external female genitalia. Vagina is pink and rugated.  Normal discharge. Normal appearing cervix. Uterus is normal in size. No adnexal mass or tenderness. Chaperone present during the pelvic exam EXTREMITIES: No cyanosis, clubbing, or edema, 2+ distal pulses.     Assessment:    Healthy female exam.  Plan:    Pap smear collected Patient agreed to STI screening via vaginal swab. She declined blood work Patient will be contacted with abnormal results Advised to take a prenatal vitamins See After Visit Summary for Counseling Recommendations

## 2022-06-01 LAB — CERVICOVAGINAL ANCILLARY ONLY
Chlamydia: NEGATIVE
Comment: NEGATIVE
Comment: NORMAL
Neisseria Gonorrhea: NEGATIVE

## 2022-06-02 LAB — CYTOLOGY - PAP
Comment: NEGATIVE
Diagnosis: NEGATIVE
High risk HPV: NEGATIVE

## 2022-06-03 MED ORDER — METRONIDAZOLE 500 MG PO TABS: 500.0000 mg | ORAL_TABLET | Freq: Two times a day (BID) | ORAL | 0 refills | Status: DC

## 2022-06-03 NOTE — Addendum Note (Signed)
Addended by: Mora Bellman on: 06/03/2022 11:20 AM   Modules accepted: Orders

## 2022-07-05 ENCOUNTER — Ambulatory Visit
Admission: EM | Admit: 2022-07-05 | Discharge: 2022-07-05 | Disposition: A | Discharge status: Home / Self Care | Payer: Medicaid Other | Attending: Urgent Care | Admitting: Urgent Care

## 2022-07-05 DIAGNOSIS — Z3201 Encounter for pregnancy test, result positive: Secondary | ICD-10-CM | POA: Insufficient documentation

## 2022-07-05 DIAGNOSIS — B3731 Acute candidiasis of vulva and vagina: Secondary | ICD-10-CM | POA: Insufficient documentation

## 2022-07-05 DIAGNOSIS — Z8619 Personal history of other infectious and parasitic diseases: Secondary | ICD-10-CM | POA: Insufficient documentation

## 2022-07-05 LAB — POCT URINALYSIS DIP (MANUAL ENTRY)
Bilirubin, UA: NEGATIVE
Blood, UA: NEGATIVE
Glucose, UA: NEGATIVE mg/dL
Ketones, POC UA: NEGATIVE mg/dL
Nitrite, UA: NEGATIVE
Protein Ur, POC: 30 mg/dL — AB
Spec Grav, UA: 1.025 (ref 1.010–1.025)
Urobilinogen, UA: 0.2 E.U./dL
pH, UA: 6.5 (ref 5.0–8.0)

## 2022-07-05 LAB — POCT URINE PREGNANCY: Preg Test, Ur: POSITIVE — AB

## 2022-07-05 MED ORDER — CLOTRIMAZOLE 1 % VA CREA: 1.0000 | TOPICAL_CREAM | Freq: Every day | VAGINAL | 0 refills | Status: DC

## 2022-07-05 NOTE — ED Triage Notes (Signed)
Pt c/o vaginal d/c x 3 days-pt states she was dx with trich by GYN on 11/31-completes  abx today-also requesting preg test-NAD-steady gait

## 2022-07-05 NOTE — ED Provider Notes (Addendum)
[facility]  Note:  This document was prepared using Conservation officer, historic buildings and may include unintentional dictation errors.  MRN: [id number] DOB: 23-Dec-1990  Subjective:   Sarah Collier is a 31 y.o. female presenting for 3-day history of acute onset persistent vaginal discharge, genital irritation.  Patient was recently treated for trichomoniasis and finished the antibiotics today.  She is concerned about a yeast infection given her recent antibiotic use.  She is not opposed to getting complete STI testing.  LMP was at the beginning of October 2023.  Would like to make sure about a pregnancy test.  No current facility-administered medications for this encounter.  Current Outpatient Medications:    albuterol (VENTOLIN HFA) 108 (90 Base) MCG/ACT inhaler, Inhale 1-2 puffs into the lungs every 6 (six) hours as needed for wheezing or shortness of breath. (Patient not taking: Reported on 05/31/2022), Disp: 18 g, Rfl: 0   metroNIDAZOLE (FLAGYL) 500 MG tablet, Take 1 tablet (500 mg total) by mouth 2 (two) times daily., Disp: 14 tablet, Rfl: 0   norelgestromin-ethinyl estradiol [facility] [employer]) 150-35 MCG/24HR transdermal patch, Place 1 patch onto the skin once a week. (Patient not taking: Reported on 05/31/2022), Disp: 3 patch, Rfl: 12   No Known Allergies  Past Medical History:  Diagnosis Date   Infection    gonorrhea, chlamydia   PID (acute pelvic inflammatory disease)    Pregnancy induced hypertension      Past Surgical History:  Procedure Laterality Date   CESAREAN SECTION N/A 03/19/2017   Procedure: CESAREAN SECTION;  Surgeon: Adam Phenix, MD;  Location: [facility];  Service: Obstetrics;  Laterality: N/A;   THERAPEUTIC ABORTION     WISDOM TOOTH EXTRACTION      Family History  Problem Relation Age of Onset   Hypertension Mother    Asthma Father    Hypertension Father    Diabetes Maternal Grandmother    Hearing loss Neg Hx     Social  History   Tobacco Use   Smoking status: Every Day    Types: Cigars   Smokeless tobacco: Never  Vaping Use   Vaping Use: Never used  Substance Use Topics   Alcohol use: Yes    Comment: occ   Drug use: Yes    Types: Marijuana    ROS   Objective:   Vitals: BP (!) 144/90 (BP Location: Right Arm)   Pulse 92   Temp 98.6 F (37 C) (Oral)   Resp 20   LMP 05/13/2022   SpO2 99%   Physical Exam Constitutional:      General: She is not in acute distress.    Appearance: Normal appearance. She is well-developed. She is not ill-appearing, toxic-appearing or diaphoretic.  HENT:     Head: Normocephalic and atraumatic.     Nose: Nose normal.     Mouth/Throat:     Mouth: Mucous membranes are moist.     Pharynx: Oropharynx is clear.  Eyes:     General: No scleral icterus.       Right eye: No discharge.        Left eye: No discharge.     Extraocular Movements: Extraocular movements intact.     Conjunctiva/sclera: Conjunctivae normal.  Cardiovascular:     Rate and Rhythm: Normal rate.  Pulmonary:     Effort: Pulmonary effort is normal.  Abdominal:     General: Bowel sounds are normal. There is no distension.     Palpations: Abdomen is  soft. There is no mass.     Tenderness: There is no abdominal tenderness. There is no right CVA tenderness, left CVA tenderness, guarding or rebound.  Skin:    General: Skin is warm and dry.  Neurological:     General: No focal deficit present.     Mental Status: She is alert and oriented to person, place, and time.  Psychiatric:        Mood and Affect: Mood normal.        Behavior: Behavior normal.        Thought Content: Thought content normal.        Judgment: Judgment normal.    Results for orders placed or performed during the hospital encounter of 07/05/22 (from the past 24 hour(s))  POCT urine pregnancy     Status: Abnormal   Collection Time: 07/05/22  1:07 PM  Result Value Ref Range   Preg Test, Ur Positive (A) Negative  POCT  urinalysis dipstick     Status: Abnormal   Collection Time: 07/05/22  1:07 PM  Result Value Ref Range   Color, UA yellow yellow   Clarity, UA cloudy (A) clear   Glucose, UA negative negative mg/dL   Bilirubin, UA negative negative   Ketones, POC UA negative negative mg/dL   Spec Grav, UA 9.518 8.416 - 1.025   Blood, UA negative negative   pH, UA 6.5 5.0 - 8.0   Protein Ur, POC =30 (A) negative mg/dL   Urobilinogen, UA 0.2 0.2 or 1.0 E.U./dL   Nitrite, UA Negative Negative   Leukocytes, UA Small (1+) (A) Negative    Assessment and Plan :   PDMP not reviewed this encounter.  1. Yeast vaginitis   2. Positive pregnancy test   3. History of trichomoniasis     Recommended clotrimazole intravaginally as she is pregnant.  Will be her second pregnancy.  She does have an [facility]N [facility].  Advised calling for follow-up with them. General counseling provided including avoidance of alcohol and cigarettes, caffeine.  Counseled on medications generally safe in pregnancy.  Counseled patient on potential for adverse effects with medications prescribed/recommended today, ER and return-to-clinic precautions discussed, patient verbalized understanding.    No treatment necessary for trichomoniasis as she just completed treatment today and therefore if her test is positive recommend watchful monitoring.   Wallis Bamberg, [place] 07/05/22 1656

## 2022-07-06 LAB — CERVICOVAGINAL ANCILLARY ONLY
Bacterial Vaginitis (gardnerella): POSITIVE — AB
Candida Glabrata: NEGATIVE
Candida Vaginitis: POSITIVE — AB
Chlamydia: NEGATIVE
Comment: NEGATIVE
Comment: NEGATIVE
Comment: NEGATIVE
Comment: NEGATIVE
Comment: NEGATIVE
Comment: NORMAL
Neisseria Gonorrhea: NEGATIVE
Trichomonas: NEGATIVE

## 2022-07-07 ENCOUNTER — Telehealth (HOSPITAL_COMMUNITY): Payer: Self-pay | Admitting: Emergency Medicine

## 2022-07-07 MED ORDER — METRONIDAZOLE 0.75 % VA GEL: 1.0000 | Freq: Every day | VAGINAL | 0 refills | Status: AC

## 2022-07-19 ENCOUNTER — Ambulatory Visit (INDEPENDENT_AMBULATORY_CARE_PROVIDER_SITE_OTHER): Payer: Medicaid Other

## 2022-07-19 VITALS — BP 126/87 | HR 89 | Ht <= 58 in

## 2022-07-19 DIAGNOSIS — O0990 Supervision of high risk pregnancy, unspecified, unspecified trimester: Secondary | ICD-10-CM | POA: Insufficient documentation

## 2022-07-19 DIAGNOSIS — Z3480 Encounter for supervision of other normal pregnancy, unspecified trimester: Secondary | ICD-10-CM

## 2022-07-19 DIAGNOSIS — Z3A09 9 weeks gestation of pregnancy: Secondary | ICD-10-CM

## 2022-07-19 DIAGNOSIS — O3680X0 Pregnancy with inconclusive fetal viability, not applicable or unspecified: Secondary | ICD-10-CM | POA: Diagnosis not present

## 2022-07-19 DIAGNOSIS — O10919 Unspecified pre-existing hypertension complicating pregnancy, unspecified trimester: Secondary | ICD-10-CM

## 2022-07-19 DIAGNOSIS — O219 Vomiting of pregnancy, unspecified: Secondary | ICD-10-CM

## 2022-07-19 MED ORDER — DICLEGIS 10-10 MG PO TBEC: 2.0000 | DELAYED_RELEASE_TABLET | Freq: Every day | ORAL | 5 refills | Status: DC

## 2022-07-19 MED ORDER — GOJJI WEIGHT SCALE MISC: 1.0000 | 0 refills | Status: DC

## 2022-07-19 MED ORDER — BLOOD PRESSURE KIT DEVI: 1.0000 | 0 refills | Status: DC

## 2022-07-19 MED ORDER — LABETALOL HCL 100 MG PO TABS: 100.0000 mg | ORAL_TABLET | Freq: Two times a day (BID) | ORAL | 1 refills | Status: DC

## 2022-07-19 MED ORDER — VITAFOL ULTRA 29-0.6-0.4-200 MG PO CAPS: 1.0000 | ORAL_CAPSULE | Freq: Every day | ORAL | 11 refills | Status: AC

## 2022-07-19 NOTE — Progress Notes (Cosign Needed Addendum)
New OB Intake  I connected with Sarah Collier on 07/19/22 at  3:10 PM EST by in person and verified that I am speaking with the correct person using two identifiers. Nurse is located at [facility] and pt is located at [place].  I discussed the limitations, risks, security and privacy concerns of performing an evaluation and management service by telephone and the availability of in person appointments. I also discussed with the patient that there may be a patient responsible charge related to this service. The patient expressed understanding and agreed to proceed.  I explained I am completing New OB Intake today. We discussed EDD of 02/18/23 that is based on LMP of 05/14/22. Pt is G4/P1021. I reviewed her allergies, medications, Medical/Surgical/OB history, and appropriate screenings. I informed her of [facility] services. [facility] information placed in AVS. Based on history, this is a low risk pregnancy.  Patient Active Problem List   Diagnosis Date Noted   Hypertension 04/06/2017   S/P C-section 04/06/2017   Chronic hypertension during pregnancy, antepartum 02/23/2017   Mild tetrahydrocannabinol [facility]) abuse 09/26/2016    Concerns addressed today  Delivery Plans Plans to deliver at [facility] Patient given information for [facility] Healthy Baby website for more information about [facility]. Patient is not interested in water birth. Offered upcoming OB visit with CNM to discuss further.  MyChart/Babyscripts MyChart access verified. I explained pt will have some visits in office and some virtually. Babyscripts instructions given and order placed. Patient verifies receipt of registration text/e-mail. Account successfully created and app downloaded.  Blood Pressure Cuff/Weight Scale Blood pressure cuff ordered for patient to pick-up from [employer]. Explained after first prenatal appt pt will check weekly and document in Babyscripts. Patient does not have weight scale; order sent to  [facility], patient may track weight weekly in Babyscripts.  Anatomy US Explained first scheduled [place] will be around 19 weeks. Dating and viability u/s performed today. Anatomy US TBD.  Labs Discussed Sarah Collier genetic screening with patient. Would like both Panorama and Horizon drawn at new OB visit. Routine prenatal labs needed.  COVID Vaccine Patient has had COVID vaccine.   Is patient a CenteringPregnancy candidate?  Declined Declined due to Group setting Not a candidate due to [facility], medication controlled If accepted,   Social Determinants of Health Food Insecurity: Patient denies food insecurity. WIC Referral: Patient is interested in referral to [facility].  Transportation: Patient denies transportation needs. Childcare: Discussed no children allowed at ultrasound appointments. Offered childcare services; patient declines childcare services at this time.  First visit review I reviewed new OB appt with patient. I explained they will have a provider visit that includes Labs, pap, std screening, genetic screening, and discuss plan of care for pregnancy. Explained pt will be seen by Nettie Elm at first visit; encounter routed to appropriate provider. Explained that patient will be seen by pregnancy navigator following visit with provider.   Hamilton Capri, RN 07/19/2022  3:37 PM

## 2022-07-19 NOTE — Progress Notes (Cosign Needed Addendum)
Patient started on Labetalol 100 mg BID for chronic hypertension. Patient advised to check BP two times per week and report any symptoms of lightheadedness or dizziness as well as low BP. Patient verbalized understanding.

## 2022-07-19 NOTE — Addendum Note (Signed)
Addended by: Hamilton Capri on: 07/19/2022 05:03 PM   Modules accepted: Orders

## 2022-07-21 LAB — CULTURE, OB URINE

## 2022-07-21 LAB — URINE CULTURE, OB REFLEX

## 2022-07-29 ENCOUNTER — Encounter: Payer: Self-pay | Admitting: Obstetrics and Gynecology

## 2022-08-01 NOTE — L&D Delivery Note (Signed)
Delivery Note Sarah Collier is a 32 y.o. [id number] at [redacted]w[redacted]d admitted for IOL for [facility].   GBS Status:  Positive/-- (06/26 1442)  Labor course: Initial SVE: 0/thick/-3. Augmentation with: AROM and Pitocin. She then progressed to complete.  ROM: 4h 41m with clear fluid  Birth: After a 15 minute 2nd stage, she delivered a Live born female  Birth Weight:   APGAR: 8, 9  Newborn Delivery   Birth date/time: 02/04/2023 18:20:00 Delivery type: Vaginal, Spontaneous         Delivered via vaginal birth after cesarean (VBAC) (Presentation: ROA ). Nuchal cord present: No. . Shoulders and body delivered in usual fashion. Infant placed directly on mom's abdomen for bonding/skin-to-skin, baby dried and stimulated. Cord clamped x 2 after 1 minute and cut by FOB.  Cord blood collected. Placenta delivered-Spontaneous;Expressed  with 3 vessels . 20u Pitocin in 500cc LR given as a bolus prior to delivery of placenta.  Fundus firm with massage. Placenta inspected and appears to be intact with a 3 VC.  Sponge and instrument count were correct x2.  Intrapartum complications:  None Anesthesia:  epidural Lacerations:  none Suture Repair: n/a EBL (mL):157.00     Mom to postpartum.  Baby to Couplet care / Skin to Skin. Placenta to L&D   Plans to Bottlefeed Contraception: none Circumcision: N/A  Note sent to [facility]: [facility] for pp visit.  Delivery Report:  Review the Delivery Report for details.     Signed: Jacklyn Shell, DNP,CNM 02/04/2023, 6:49 PM

## 2022-08-02 ENCOUNTER — Other Ambulatory Visit: Payer: Self-pay

## 2022-08-02 DIAGNOSIS — B3731 Acute candidiasis of vulva and vagina: Secondary | ICD-10-CM

## 2022-08-02 MED ORDER — TERCONAZOLE 0.8 % VA CREA: 1.0000 | TOPICAL_CREAM | Freq: Every day | VAGINAL | 0 refills | Status: DC

## 2022-08-02 NOTE — Addendum Note (Signed)
Addended by: Maryruth Eve on: 08/02/2022 10:34 AM   Modules accepted: Orders

## 2022-08-02 NOTE — Progress Notes (Signed)
Pt thinks she has another yeast infection. She reports vaginal irritation and denies vaginal odor.   Terconazole sent per protocol.  If no relief after treatment, patient will need an appt.

## 2022-08-18 ENCOUNTER — Other Ambulatory Visit: Payer: Medicaid Other

## 2022-08-18 DIAGNOSIS — Z3482 Encounter for supervision of other normal pregnancy, second trimester: Secondary | ICD-10-CM

## 2022-08-18 DIAGNOSIS — Z3480 Encounter for supervision of other normal pregnancy, unspecified trimester: Secondary | ICD-10-CM

## 2022-08-19 LAB — COMPREHENSIVE METABOLIC PANEL
ALT: 14 IU/L (ref 0–32)
AST: 17 IU/L (ref 0–40)
Albumin/Globulin Ratio: 1.3 (ref 1.2–2.2)
Albumin: 3.9 g/dL (ref 3.9–4.9)
Alkaline Phosphatase: 40 IU/L — ABNORMAL LOW (ref 44–121)
BUN/Creatinine Ratio: 9 (ref 9–23)
BUN: 5 mg/dL — ABNORMAL LOW (ref 6–20)
Bilirubin Total: <0.2 mg/dL (ref 0.0–1.2)
CO2: 22 mmol/L (ref 20–29)
Calcium: 9.2 mg/dL (ref 8.7–10.2)
Chloride: 101 mmol/L (ref 96–106)
Creatinine, Ser: 0.56 mg/dL — ABNORMAL LOW (ref 0.57–1.00)
Globulin, Total: 3 g/dL (ref 1.5–4.5)
Glucose: 85 mg/dL (ref 70–99)
Potassium: 3.9 mmol/L (ref 3.5–5.2)
Sodium: 137 mmol/L (ref 134–144)
Total Protein: 6.9 g/dL (ref 6.0–8.5)
eGFR: 125 mL/min/{1.73_m2} (ref >59)

## 2022-08-19 LAB — CBC/D/PLT+RPR+RH+ABO+RUBIGG...
Antibody Screen: NEGATIVE
Basophils Absolute: 0.1 10*3/uL (ref 0.0–0.2)
Basos: 1 %
EOS (ABSOLUTE): 0.2 10*3/uL (ref 0.0–0.4)
Eos: 2 %
HCV Ab: NONREACTIVE
HIV Screen 4th Generation wRfx: NONREACTIVE
Hematocrit: 32.1 % — ABNORMAL LOW (ref 34.0–46.6)
Hemoglobin: 10.9 g/dL — ABNORMAL LOW (ref 11.1–15.9)
Hepatitis B Surface Ag: NEGATIVE
Immature Grans (Abs): 0 10*3/uL (ref 0.0–0.1)
Immature Granulocytes: 0 %
Lymphocytes Absolute: 2.6 10*3/uL (ref 0.7–3.1)
Lymphs: 29 %
MCH: 29.9 pg (ref 26.6–33.0)
MCHC: 34 g/dL (ref 31.5–35.7)
MCV: 88 fL (ref 79–97)
Monocytes Absolute: 0.6 10*3/uL (ref 0.1–0.9)
Monocytes: 7 %
Neutrophils Absolute: 5.4 10*3/uL (ref 1.4–7.0)
Neutrophils: 61 %
Platelets: 346 10*3/uL (ref 150–450)
RBC: 3.64 x10E6/uL — ABNORMAL LOW (ref 3.77–5.28)
RDW: 11.2 % — ABNORMAL LOW (ref 11.7–15.4)
RPR Ser Ql: NONREACTIVE
Rh Factor: POSITIVE
Rubella Antibodies, IGG: 1.92 index (ref >0.99)
WBC: 8.8 10*3/uL (ref 3.4–10.8)

## 2022-08-19 LAB — HEMOGLOBIN A1C
Est. average glucose Bld gHb Est-mCnc: 97 mg/dL
Hgb A1c MFr Bld: 5 % (ref 4.8–5.6)

## 2022-08-19 LAB — HCV INTERPRETATION

## 2022-08-25 LAB — PANORAMA PRENATAL TEST FULL PANEL:PANORAMA TEST PLUS 5 ADDITIONAL MICRODELETIONS: FETAL FRACTION: 17.5

## 2022-08-30 LAB — HORIZON CUSTOM: REPORT SUMMARY: NEGATIVE

## 2022-08-31 ENCOUNTER — Encounter: Payer: Self-pay | Admitting: Obstetrics and Gynecology

## 2022-08-31 ENCOUNTER — Other Ambulatory Visit (HOSPITAL_COMMUNITY)
Admission: RE | Admit: 2022-08-31 | Discharge: 2022-08-31 | Disposition: A | Discharge status: Home / Self Care | Payer: Medicaid Other | Source: Ambulatory Visit | Attending: Obstetrics and Gynecology | Admitting: Obstetrics and Gynecology

## 2022-08-31 ENCOUNTER — Ambulatory Visit (INDEPENDENT_AMBULATORY_CARE_PROVIDER_SITE_OTHER): Payer: Medicaid Other | Admitting: Obstetrics and Gynecology

## 2022-08-31 VITALS — BP 121/83 | HR 79 | Wt 139.0 lb

## 2022-08-31 DIAGNOSIS — Z98891 History of uterine scar from previous surgery: Secondary | ICD-10-CM

## 2022-08-31 DIAGNOSIS — Z3A15 15 weeks gestation of pregnancy: Secondary | ICD-10-CM

## 2022-08-31 DIAGNOSIS — O10912 Unspecified pre-existing hypertension complicating pregnancy, second trimester: Secondary | ICD-10-CM

## 2022-08-31 DIAGNOSIS — O10919 Unspecified pre-existing hypertension complicating pregnancy, unspecified trimester: Secondary | ICD-10-CM

## 2022-08-31 DIAGNOSIS — Z3480 Encounter for supervision of other normal pregnancy, unspecified trimester: Secondary | ICD-10-CM | POA: Diagnosis present

## 2022-08-31 MED ORDER — ASPIRIN 81 MG PO TBEC: 81.0000 mg | DELAYED_RELEASE_TABLET | Freq: Every day | ORAL | 2 refills | Status: DC

## 2022-08-31 NOTE — Addendum Note (Signed)
Addended by: Tamela Oddi on: 08/31/2022 04:06 PM   Modules accepted: Orders

## 2022-08-31 NOTE — Progress Notes (Signed)
Subjective:  Sarah Collier is a 32 y.o. G4P1021 at [redacted]w[redacted]d being seen today for her first OB visit. EDD by LMP and confirmed by first trimester U/S . H/O CHTN and LTCS due to Cat 3 strip.   She is currently monitored for the following issues for this low-risk pregnancy and has Mild tetrahydrocannabinol (THC) abuse; Chronic hypertension during pregnancy, antepartum; Hypertension; S/P C-section; and Supervision of other normal pregnancy, antepartum on their problem list.  Patient reports  general discomforts of pregnancy .  Contractions: Not present. Vag. Bleeding: None.   . Denies leaking of fluid.   The following portions of the patient's history were reviewed and updated as appropriate: allergies, current medications, past family history, past medical history, past social history, past surgical history and problem list. Problem list updated.  Objective:   Vitals:   08/31/22 1525  BP: 121/83  Pulse: 79  Weight: 139 lb (63 kg)    Fetal Status: Fetal Heart Rate (bpm): 143         General:  Alert, oriented and cooperative. Patient is in no acute distress.  Skin: Skin is warm and dry. No rash noted.   Cardiovascular: Normal heart rate noted  Respiratory: Normal respiratory effort, no problems with respiration noted  Abdomen: Soft, gravid, appropriate for gestational age. Pain/Pressure: Present     Pelvic:  Cervical exam performed        Extremities: Normal range of motion.  Edema: None  Mental Status: Normal mood and affect. Normal behavior. Normal judgment and thought content.   Urinalysis:      Assessment and Plan:  Pregnancy: G4P1021 at [redacted]w[redacted]d  1. Supervision of other normal pregnancy, antepartum Prenatal care and labs reviewed with pt Genetic testing discussed - AFP, Serum, Open Spina Bifida - Cervicovaginal ancillary only( [facility]) - [place] MFM OB COMP + 14 WK; Future  2. Chronic hypertension during pregnancy, antepartum CHTN and pregnancy reviewed. Reduction of risks with  good BP control and qd BASA Serial growth scans and antenatal testing reviewed with pt IOL briefly discussed as well - [place] MFM OB COMP + 14 WK; Future - Protein / creatinine ratio, urine - aspirin EC 81 MG tablet; Take 1 tablet (81 mg total) by mouth daily. Take after 12 weeks for prevention of preeclampsia later in pregnancy  Dispense: 300 tablet; Refill: 2  3. S/P C-section Pt desires TOLAC Discuss in more detail at later visits  Preterm labor symptoms and general obstetric precautions including but not limited to vaginal bleeding, contractions, leaking of fluid and fetal movement were reviewed in detail with the patient. Please refer to After Visit Summary for other counseling recommendations.  Return in about 4 weeks (around 09/28/2022) for OB visit, face to face, any provider.   Chancy Milroy, MD

## 2022-08-31 NOTE — Patient Instructions (Signed)

## 2022-08-31 NOTE — Progress Notes (Signed)
NOB c/o BP Rx gives her dizzy spells and nausea meds does not work.

## 2022-09-01 LAB — CERVICOVAGINAL ANCILLARY ONLY
Bacterial Vaginitis (gardnerella): POSITIVE — AB
Candida Glabrata: NEGATIVE
Candida Vaginitis: POSITIVE — AB
Chlamydia: NEGATIVE
Comment: NEGATIVE
Comment: NEGATIVE
Comment: NEGATIVE
Comment: NEGATIVE
Comment: NEGATIVE
Comment: NORMAL
Neisseria Gonorrhea: NEGATIVE
Trichomonas: NEGATIVE

## 2022-09-02 LAB — PROTEIN / CREATININE RATIO, URINE
Creatinine, Urine: 50.5 mg/dL
Protein, Ur: 10.1 mg/dL
Protein/Creat Ratio: 200 mg/g creat (ref 0–200)

## 2022-09-02 LAB — AFP, SERUM, OPEN SPINA BIFIDA
AFP MoM: 0.95
AFP Value: 34.4 ng/mL
Gest. Age on Collection Date: 15.4 weeks
Maternal Age At EDD: 31.6 yr
OSBR Risk 1 IN: 10000
Test Results:: NEGATIVE
Weight: 139 [lb_av]

## 2022-09-05 ENCOUNTER — Other Ambulatory Visit: Payer: Self-pay

## 2022-09-05 DIAGNOSIS — B379 Candidiasis, unspecified: Secondary | ICD-10-CM

## 2022-09-05 DIAGNOSIS — B9689 Other specified bacterial agents as the cause of diseases classified elsewhere: Secondary | ICD-10-CM

## 2022-09-05 MED ORDER — METRONIDAZOLE 500 MG PO TABS: 500.0000 mg | ORAL_TABLET | Freq: Two times a day (BID) | ORAL | 0 refills | Status: DC

## 2022-09-05 MED ORDER — TERCONAZOLE 0.4 % VA CREA: 1.0000 | TOPICAL_CREAM | Freq: Every day | VAGINAL | 0 refills | Status: DC

## 2022-09-19 ENCOUNTER — Encounter: Payer: Self-pay | Admitting: Obstetrics and Gynecology

## 2022-09-28 ENCOUNTER — Ambulatory Visit (INDEPENDENT_AMBULATORY_CARE_PROVIDER_SITE_OTHER): Payer: Medicaid Other | Admitting: Obstetrics and Gynecology

## 2022-09-28 ENCOUNTER — Encounter: Payer: Self-pay | Admitting: *Deleted

## 2022-09-28 ENCOUNTER — Ambulatory Visit: Payer: Medicaid Other | Admitting: *Deleted

## 2022-09-28 ENCOUNTER — Ambulatory Visit: Payer: Medicaid Other | Attending: Obstetrics and Gynecology

## 2022-09-28 ENCOUNTER — Encounter: Payer: Self-pay | Admitting: Obstetrics and Gynecology

## 2022-09-28 VITALS — BP 108/68 | HR 81

## 2022-09-28 VITALS — BP 117/82 | HR 76 | Wt 140.0 lb

## 2022-09-28 DIAGNOSIS — O10919 Unspecified pre-existing hypertension complicating pregnancy, unspecified trimester: Secondary | ICD-10-CM

## 2022-09-28 DIAGNOSIS — O10912 Unspecified pre-existing hypertension complicating pregnancy, second trimester: Secondary | ICD-10-CM | POA: Insufficient documentation

## 2022-09-28 DIAGNOSIS — O10012 Pre-existing essential hypertension complicating pregnancy, second trimester: Secondary | ICD-10-CM | POA: Diagnosis not present

## 2022-09-28 DIAGNOSIS — Z3A20 20 weeks gestation of pregnancy: Secondary | ICD-10-CM | POA: Diagnosis not present

## 2022-09-28 DIAGNOSIS — O34211 Maternal care for low transverse scar from previous cesarean delivery: Secondary | ICD-10-CM

## 2022-09-28 DIAGNOSIS — Z98891 History of uterine scar from previous surgery: Secondary | ICD-10-CM

## 2022-09-28 DIAGNOSIS — Z3A19 19 weeks gestation of pregnancy: Secondary | ICD-10-CM

## 2022-09-28 DIAGNOSIS — Z3480 Encounter for supervision of other normal pregnancy, unspecified trimester: Secondary | ICD-10-CM

## 2022-09-28 MED ORDER — ONDANSETRON 4 MG PO TBDP: 4.0000 mg | ORAL_TABLET | Freq: Four times a day (QID) | ORAL | 1 refills | Status: DC | PRN

## 2022-09-28 NOTE — Progress Notes (Signed)
Subjective:  Sarah Collier is a 32 y.o. G4P1021 at 86w4dbeing seen today for ongoing prenatal care.  She is currently monitored for the following issues for this high-risk pregnancy and has Mild tetrahydrocannabinol (THC) abuse; Chronic hypertension during pregnancy, antepartum; Hypertension; S/P C-section; and Supervision of other normal pregnancy, antepartum on their problem list.  Patient reports no complaints.  Contractions: Not present. Vag. Bleeding: None.  Movement: Present. Denies leaking of fluid.   The following portions of the patient's history were reviewed and updated as appropriate: allergies, current medications, past family history, past medical history, past social history, past surgical history and problem list. Problem list updated.  Objective:   Vitals:   09/28/22 1548  BP: 117/82  Pulse: 76  Weight: 140 lb (63.5 kg)    Fetal Status: Fetal Heart Rate (bpm): 135   Movement: Present     General:  Alert, oriented and cooperative. Patient is in no acute distress.  Skin: Skin is warm and dry. No rash noted.   Cardiovascular: Normal heart rate noted  Respiratory: Normal respiratory effort, no problems with respiration noted  Abdomen: Soft, gravid, appropriate for gestational age. Pain/Pressure: Absent     Pelvic:  Cervical exam deferred        Extremities: Normal range of motion.     Mental Status: Normal mood and affect. Normal behavior. Normal judgment and thought content.   Urinalysis:      Assessment and Plan:  Pregnancy: G4P1021 at 134w4d1. Supervision of other normal pregnancy, antepartum Stable Anatomy scan today  2. Chronic hypertension during pregnancy, antepartum Stable Continue with current regiment  3. S/P C-section Desires TOLAC Consent at 28 week visit  Preterm labor symptoms and general obstetric precautions including but not limited to vaginal bleeding, contractions, leaking of fluid and fetal movement were reviewed in detail with the  patient. Please refer to After Visit Summary for other counseling recommendations.  Return in about 4 weeks (around 10/26/2022) for OB visit, face to face, MD only.   ErChancy MilroyMD

## 2022-09-29 ENCOUNTER — Other Ambulatory Visit: Payer: Self-pay | Admitting: *Deleted

## 2022-09-29 DIAGNOSIS — O10912 Unspecified pre-existing hypertension complicating pregnancy, second trimester: Secondary | ICD-10-CM

## 2022-09-29 DIAGNOSIS — Z362 Encounter for other antenatal screening follow-up: Secondary | ICD-10-CM

## 2022-09-30 ENCOUNTER — Encounter: Payer: Self-pay | Admitting: Obstetrics and Gynecology

## 2022-10-26 ENCOUNTER — Ambulatory Visit: Payer: Medicaid Other | Admitting: *Deleted

## 2022-10-26 ENCOUNTER — Ambulatory Visit (INDEPENDENT_AMBULATORY_CARE_PROVIDER_SITE_OTHER): Payer: Medicaid Other | Admitting: Obstetrics and Gynecology

## 2022-10-26 ENCOUNTER — Encounter: Payer: Self-pay | Admitting: Obstetrics and Gynecology

## 2022-10-26 ENCOUNTER — Ambulatory Visit: Payer: Medicaid Other | Attending: Obstetrics and Gynecology

## 2022-10-26 VITALS — BP 112/76 | HR 90 | Wt 146.0 lb

## 2022-10-26 VITALS — BP 123/86 | HR 87

## 2022-10-26 DIAGNOSIS — Z3480 Encounter for supervision of other normal pregnancy, unspecified trimester: Secondary | ICD-10-CM | POA: Insufficient documentation

## 2022-10-26 DIAGNOSIS — O10912 Unspecified pre-existing hypertension complicating pregnancy, second trimester: Secondary | ICD-10-CM

## 2022-10-26 DIAGNOSIS — O34219 Maternal care for unspecified type scar from previous cesarean delivery: Secondary | ICD-10-CM | POA: Diagnosis not present

## 2022-10-26 DIAGNOSIS — O10012 Pre-existing essential hypertension complicating pregnancy, second trimester: Secondary | ICD-10-CM | POA: Diagnosis not present

## 2022-10-26 DIAGNOSIS — O10919 Unspecified pre-existing hypertension complicating pregnancy, unspecified trimester: Secondary | ICD-10-CM

## 2022-10-26 DIAGNOSIS — Z3A23 23 weeks gestation of pregnancy: Secondary | ICD-10-CM

## 2022-10-26 DIAGNOSIS — Z98891 History of uterine scar from previous surgery: Secondary | ICD-10-CM

## 2022-10-26 DIAGNOSIS — Z3A24 24 weeks gestation of pregnancy: Secondary | ICD-10-CM | POA: Diagnosis not present

## 2022-10-26 DIAGNOSIS — Z362 Encounter for other antenatal screening follow-up: Secondary | ICD-10-CM | POA: Diagnosis present

## 2022-10-26 NOTE — Progress Notes (Signed)
Pt had u/s today, recommend repeat for growth - pt is scheduled.

## 2022-10-26 NOTE — Progress Notes (Addendum)
Subjective:  Sarah Collier is a 32 y.o. G4P1021 at [redacted]w[redacted]d being seen today for ongoing prenatal care.  She is currently monitored for the following issues for this high-risk pregnancy and has Mild tetrahydrocannabinol (THC) abuse; Chronic hypertension during pregnancy, antepartum; Hypertension; S/P C-section; and Supervision of other normal pregnancy, antepartum on their problem list.  Patient reports  general discomforts of pregnancy .  Contractions: Not present. Vag. Bleeding: None.  Movement: Present. Denies leaking of fluid.   The following portions of the patient's history were reviewed and updated as appropriate: allergies, current medications, past family history, past medical history, past social history, past surgical history and problem list. Problem list updated.  Objective:   Vitals:   10/26/22 1637  BP: 112/76  Pulse: 90  Weight: 146 lb (66.2 kg)    Fetal Status: Fetal Heart Rate (bpm): 150   Movement: Present     General:  Alert, oriented and cooperative. Patient is in no acute distress.  Skin: Skin is warm and dry. No rash noted.   Cardiovascular: Normal heart rate noted  Respiratory: Normal respiratory effort, no problems with respiration noted  Abdomen: Soft, gravid, appropriate for gestational age. Pain/Pressure: Absent     Pelvic:  Cervical exam deferred        Extremities: Normal range of motion.     Mental Status: Normal mood and affect. Normal behavior. Normal judgment and thought content.   Urinalysis:      Assessment and Plan:  Pregnancy: G4P1021 at [redacted]w[redacted]d  1. Supervision of other normal pregnancy, antepartum Stable Glucola next visit  2. S/P C-section VBAC consent today  3. Chronic hypertension during pregnancy, antepartum Growth scan today. BP stable on current regiment Serial growth scans and antenatal testing as per MFM guidelines  Preterm labor symptoms and general obstetric precautions including but not limited to vaginal bleeding,  contractions, leaking of fluid and fetal movement were reviewed in detail with the patient. Please refer to After Visit Summary for other counseling recommendations.  Return in about 4 weeks (around 11/23/2022) for OB visit, face to face, any provider, fasting for Glucola.   Chancy Milroy, MD

## 2022-10-27 ENCOUNTER — Other Ambulatory Visit: Payer: Self-pay | Admitting: *Deleted

## 2022-10-27 DIAGNOSIS — O10912 Unspecified pre-existing hypertension complicating pregnancy, second trimester: Secondary | ICD-10-CM

## 2022-10-27 DIAGNOSIS — Z362 Encounter for other antenatal screening follow-up: Secondary | ICD-10-CM

## 2022-10-27 DIAGNOSIS — Z3689 Encounter for other specified antenatal screening: Secondary | ICD-10-CM

## 2022-10-27 DIAGNOSIS — O34219 Maternal care for unspecified type scar from previous cesarean delivery: Secondary | ICD-10-CM

## 2022-11-07 ENCOUNTER — Encounter: Payer: Self-pay | Admitting: Obstetrics and Gynecology

## 2022-11-23 ENCOUNTER — Other Ambulatory Visit: Payer: Medicaid Other

## 2022-11-23 ENCOUNTER — Encounter: Payer: Self-pay | Admitting: Obstetrics and Gynecology

## 2022-11-23 ENCOUNTER — Encounter: Payer: Self-pay | Admitting: *Deleted

## 2022-11-25 ENCOUNTER — Ambulatory Visit: Payer: Medicaid Other

## 2022-11-25 ENCOUNTER — Other Ambulatory Visit: Payer: Medicaid Other

## 2022-11-25 ENCOUNTER — Ambulatory Visit (INDEPENDENT_AMBULATORY_CARE_PROVIDER_SITE_OTHER): Payer: Medicaid Other | Admitting: Obstetrics & Gynecology

## 2022-11-25 ENCOUNTER — Ambulatory Visit: Payer: Medicaid Other | Attending: Maternal & Fetal Medicine

## 2022-11-25 ENCOUNTER — Ambulatory Visit: Payer: Medicaid Other | Admitting: *Deleted

## 2022-11-25 VITALS — BP 124/73 | HR 99

## 2022-11-25 VITALS — BP 116/77 | HR 94 | Wt 152.0 lb

## 2022-11-25 DIAGNOSIS — F1210 Cannabis abuse, uncomplicated: Secondary | ICD-10-CM

## 2022-11-25 DIAGNOSIS — O10012 Pre-existing essential hypertension complicating pregnancy, second trimester: Secondary | ICD-10-CM

## 2022-11-25 DIAGNOSIS — Z362 Encounter for other antenatal screening follow-up: Secondary | ICD-10-CM | POA: Insufficient documentation

## 2022-11-25 DIAGNOSIS — Z3A27 27 weeks gestation of pregnancy: Secondary | ICD-10-CM

## 2022-11-25 DIAGNOSIS — Z98891 History of uterine scar from previous surgery: Secondary | ICD-10-CM

## 2022-11-25 DIAGNOSIS — Z3482 Encounter for supervision of other normal pregnancy, second trimester: Secondary | ICD-10-CM | POA: Diagnosis not present

## 2022-11-25 DIAGNOSIS — O10919 Unspecified pre-existing hypertension complicating pregnancy, unspecified trimester: Secondary | ICD-10-CM

## 2022-11-25 DIAGNOSIS — O10912 Unspecified pre-existing hypertension complicating pregnancy, second trimester: Secondary | ICD-10-CM | POA: Insufficient documentation

## 2022-11-25 DIAGNOSIS — Z3689 Encounter for other specified antenatal screening: Secondary | ICD-10-CM | POA: Diagnosis present

## 2022-11-25 DIAGNOSIS — O34219 Maternal care for unspecified type scar from previous cesarean delivery: Secondary | ICD-10-CM

## 2022-11-25 DIAGNOSIS — Z3480 Encounter for supervision of other normal pregnancy, unspecified trimester: Secondary | ICD-10-CM

## 2022-11-25 MED ORDER — NIFEDIPINE ER OSMOTIC RELEASE 30 MG PO TB24: 30.0000 mg | ORAL_TABLET | Freq: Every day | ORAL | 2 refills | Status: AC

## 2022-11-25 NOTE — Progress Notes (Signed)
   PRENATAL VISIT NOTE  Subjective:  Sarah Collier is a 32 y.o. G4P1021 at [redacted]w[redacted]d being seen today for ongoing prenatal care.  She is currently monitored for the following issues for this high-risk pregnancy and has Mild tetrahydrocannabinol (THC) abuse; Chronic hypertension during pregnancy, antepartum; Hypertension; and Supervision of other normal pregnancy, antepartum on their problem list.  Patient reports  occasionally does not feel well after taking her labetalol .  Contractions: Not present. Vag. Bleeding: None.  Movement: Present. Denies leaking of fluid.   The following portions of the patient's history were reviewed and updated as appropriate: allergies, current medications, past family history, past medical history, past social history, past surgical history and problem list.   Objective:   Vitals:   11/25/22 0840  BP: 116/77  Pulse: 94  Weight: 152 lb (68.9 kg)    Fetal Status: Fetal Heart Rate (bpm): 140   Movement: Present     General:  Alert, oriented and cooperative. Patient is in no acute distress.  Skin: Skin is warm and dry. No rash noted.   Cardiovascular: Normal heart rate noted  Respiratory: Normal respiratory effort, no problems with respiration noted  Abdomen: Soft, gravid, appropriate for gestational age.  Pain/Pressure: Absent     Pelvic: Cervical exam deferred        Extremities: Normal range of motion.     Mental Status: Normal mood and affect. Normal behavior. Normal judgment and thought content.   Assessment and Plan:  Pregnancy: G4P1021 at [redacted]w[redacted]d 1. Chronic hypertension during pregnancy, antepartum Change to Procardia  2. Supervision of other normal pregnancy, antepartum F/u US today  3. Mild tetrahydrocannabinol (THC) abuse   Preterm labor symptoms and general obstetric precautions including but not limited to vaginal bleeding, contractions, leaking of fluid and fetal movement were reviewed in detail with the patient. Please refer to After  Visit Summary for other counseling recommendations.   Return in about 2 weeks (around 12/09/2022).  Future Appointments  Date Time Provider Department Center  11/25/2022  3:15 PM [facility] NURSE [facility]  11/25/2022  3:30 PM [facility] US2 [facility]    Scheryl Darter, MD

## 2022-11-25 NOTE — Progress Notes (Signed)
Pt is scheduled for u/s this afternoon. Pt states she will get Tdap next visit.

## 2022-11-26 LAB — CBC
Hematocrit: 33.5 % — ABNORMAL LOW (ref 34.0–46.6)
Hemoglobin: 11.2 g/dL (ref 11.1–15.9)
MCH: 30 pg (ref 26.6–33.0)
MCHC: 33.4 g/dL (ref 31.5–35.7)
MCV: 90 fL (ref 79–97)
Platelets: 273 10*3/uL (ref 150–450)
RBC: 3.73 x10E6/uL — ABNORMAL LOW (ref 3.77–5.28)
RDW: 11.4 % — ABNORMAL LOW (ref 11.7–15.4)
WBC: 8.2 10*3/uL (ref 3.4–10.8)

## 2022-11-26 LAB — GLUCOSE TOLERANCE, 2 HOURS W/ 1HR
Glucose, 1 hour: 127 mg/dL (ref 70–179)
Glucose, 2 hour: 105 mg/dL (ref 70–152)
Glucose, Fasting: 81 mg/dL (ref 70–91)

## 2022-11-26 LAB — RPR: RPR Ser Ql: NONREACTIVE

## 2022-11-26 LAB — HIV ANTIBODY (ROUTINE TESTING W REFLEX): HIV Screen 4th Generation wRfx: NONREACTIVE

## 2022-11-28 ENCOUNTER — Other Ambulatory Visit: Payer: Self-pay | Admitting: *Deleted

## 2022-11-28 DIAGNOSIS — O10913 Unspecified pre-existing hypertension complicating pregnancy, third trimester: Secondary | ICD-10-CM

## 2022-12-12 ENCOUNTER — Encounter: Payer: Medicaid Other | Admitting: Obstetrics and Gynecology

## 2022-12-16 ENCOUNTER — Encounter: Payer: Medicaid Other | Admitting: Student

## 2022-12-22 ENCOUNTER — Telehealth (INDEPENDENT_AMBULATORY_CARE_PROVIDER_SITE_OTHER): Payer: Medicaid Other

## 2022-12-22 DIAGNOSIS — O10919 Unspecified pre-existing hypertension complicating pregnancy, unspecified trimester: Secondary | ICD-10-CM

## 2022-12-22 DIAGNOSIS — Z3A31 31 weeks gestation of pregnancy: Secondary | ICD-10-CM

## 2022-12-22 DIAGNOSIS — Z3480 Encounter for supervision of other normal pregnancy, unspecified trimester: Secondary | ICD-10-CM

## 2022-12-22 DIAGNOSIS — O34219 Maternal care for unspecified type scar from previous cesarean delivery: Secondary | ICD-10-CM

## 2022-12-22 DIAGNOSIS — Z98891 History of uterine scar from previous surgery: Secondary | ICD-10-CM

## 2022-12-22 DIAGNOSIS — O10013 Pre-existing essential hypertension complicating pregnancy, third trimester: Secondary | ICD-10-CM

## 2022-12-22 NOTE — Progress Notes (Signed)
OBSTETRICS PRENATAL VIRTUAL VISIT ENCOUNTER NOTE  Provider location: [facility]   Patient location: Home  I connected with Sarah Collier on 12/22/22 at  1:30 PM EDT by MyChart Video Encounter and verified that I am speaking with the correct person using two identifiers. I discussed the limitations, risks, security and privacy concerns of performing an evaluation and management service virtually and the availability of in person appointments. I also discussed with the patient that there may be a patient responsible charge related to this service. The patient expressed understanding and agreed to proceed. Subjective:  TIANNA BASTIANELLI is a 32 y.o. G4P1021 at [redacted]w[redacted]d being seen today for ongoing prenatal care.  She is currently monitored for the following issues for this high-risk pregnancy and has Mild tetrahydrocannabinol (THC) abuse; Chronic hypertension during pregnancy, antepartum; Hypertension; Supervision of other normal pregnancy, antepartum; and History of cesarean section on their problem list.  Patient reports  she is doing well .  Patient endorses fetal movement. She reports some pressure in lower abdominal area. No issues with urination, constipation, or diarrhea. Contractions: Not present. Vag. Bleeding: None.  Movement: Present. Denies any leaking of fluid.   The following portions of the patient's history were reviewed and updated as appropriate: allergies, current medications, past family history, past medical history, past social history, past surgical history and problem list.   Objective:  There were no vitals filed for this visit.  Fetal Status:     Movement: Present     General:  Alert, oriented and cooperative. Patient is in no acute distress.  Respiratory: Normal respiratory effort, no problems with respiration noted  Mental Status: Normal mood and affect. Normal behavior. Normal judgment and thought content.  Rest of physical exam deferred  due to type of encounter  Imaging: [place] MFM OB FOLLOW UP  Result Date: 11/25/2022 ----------------------------------------------------------------------  OBSTETRICS REPORT                       (Signed Final 11/25/2022 04:17 pm) ---------------------------------------------------------------------- Patient Info  ID #:       [id number]                          D.O.B.:  13-Jan-1991 (31 yrs)  Name:       Sarah Collier                Visit Date: 11/25/2022 03:35 pm ---------------------------------------------------------------------- Performed By  Attending:        Braxton Feathers DO       Ref. Address:     [address]                                                             [phone]  [place]                                                             [address]  Performed By:     Anabel Halon          [facility]  Referred By:      [facility] ---------------------------------------------------------------------- Orders  #  Description                           Code        Ordered By  1  [place] MFM OB FOLLOW UP                   60454.09    Lin Landsman ----------------------------------------------------------------------  #  Order #                     Accession #                Episode #  1  [id number]                   [id number]                 [id number] ---------------------------------------------------------------------- Indications  Hypertension - Chronic/Pre-existing            O10.019  [redacted] weeks gestation of pregnancy                Z3A.27  Encounter for other antenatal screening         Z36.2  follow-up  History of cesarean delivery, currently        O34.219  pregnant  NIPS LR female, AFP neg, ---------------------------------------------------------------------- Fetal Evaluation  Num Of Fetuses:         1  Fetal Heart Rate(bpm):  135  Cardiac Activity:       Observed  Presentation:           Cephalic  Placenta:               Posterior  P. Cord Insertion:      Visualized, central  Amniotic Fluid  AFI FV:      Within normal limits                              Largest Pocket(cm)                              7.4 ---------------------------------------------------------------------- Biometry  BPD:      69.3  mm     G. Age:  27w 6d         38  %    CI:         77.6   %    70 - 86                                                          FL/HC:      19.7   %    18.8 - 20.6  HC:       249   mm     G. Age:  27w 0d          6  %    HC/AC:      1.06        1.05 - 1.21  AC:      234.6  mm     G. Age:  27w 6d         40  %    FL/BPD:     70.7   %    71 - 87  FL:         49  mm     G. Age:  26w 3d          7  %    FL/AC:      20.9   %    20 - 24  Est. FW:    1051  gm      2 lb 5 oz     18  % ---------------------------------------------------------------------- OB History  Blood Type:   AB+  Maternal Racial/Ethnic Group:   Black (non-Hispanic)  Gravidity:    4         Term:   1        Prem:   0        SAB:   0  TOP:          2       Ectopic:  0        Living: 1 ---------------------------------------------------------------------- Gestational Age  LMP:           27w 6d        Date:  05/14/22                 EDD:   02/18/23  U/S Today:     27w 2d  EDD:   02/22/23  Best:          27w 6d     Det. By:  LMP  (05/14/22)          EDD:   02/18/23 ---------------------------------------------------------------------- Anatomy  Cranium:               Appears normal         LVOT:                   Previously seen  Cavum:                 Previously seen        Aortic Arch:             Previously seen  Ventricles:            Previously seen        Ductal Arch:            Appears normal  Choroid Plexus:        Previously seen        Diaphragm:              Appears normal  Cerebellum:            Previously seen        Stomach:                Previously Seen  Posterior Fossa:       Previously seen        Abdomen:                Previously seen  Nuchal Fold:           Previously seen        Abdominal Wall:         Previously seen  Face:                  Orbits and profile     Cord Vessels:           Previously seen                         previously seen  Lips:                  Previously seen        Kidneys:                Appear normal  Palate:                Not well visualized    Bladder:                Appears normal  Thoracic:              Appears normal         Spine:                  Previously seen  Heart:                 Previously seen        Upper Extremities:      Previously seen  RVOT:                  Previously seen        Lower Extremities:      Previously seen  Other:  VC, 3VV, 3VTV, Profile, Spine, 4CH, RVOT, LVOT & DA prev  visualized. ---------------------------------------------------------------------- Cervix Uterus Adnexa  Cervix  Not visualized (advanced GA >24wks)  Uterus  No abnormality visualized.  Right Ovary  Within normal limits.  Left Ovary  Within normal limits.  Cul De Sac  No free fluid seen.  Adnexa  No abnormality visualized ---------------------------------------------------------------------- Comments  The patient is here for a follow-up ultrasound at 27w 6d for  [facility]. EDD: 02/18/2023 dated by LMP  (05/14/22). She has  no concerns today.  Sonographic findings  Single intrauterine pregnancy.  Fetal cardiac activity:  Observed and appears normal.  Presentation: Cephalic.  Interval fetal anatomy appears normal.  Fetal biometry shows the estimated fetal weight at the 18  percentile.  Amniotic fluid volume: Within normal limits. MVP: 7.4 cm.  Placenta:  Posterior.  Recommendations  1. Serial growth ultrasounds every 4 weeks until delivery  2. Antenatal testing to start around 32 weeks  3. Delivery around [redacted] weeks gestation ----------------------------------------------------------------------                  Braxton Feathers, DO Electronically Signed Final Report   11/25/2022 04:17 pm ----------------------------------------------------------------------   Assessment and Plan:  Pregnancy: Z6X0960 at [redacted]w[redacted]d 1. Supervision of other normal pregnancy, antepartum -Anticipatory guidance for upcoming appts. -Patient to schedule next appt in 2 weeks for an in-person visit.   2. [redacted] weeks gestation of pregnancy -Doing well. -Discussed some usage of maternity belt for lower abdominal pressure/discomfort. Information placed information in AVS.   3. History of cesarean section -Desires VBAC -Consents signed 10/26/2022  4. Chronic hypertension during pregnancy, antepartum -Patient was taking labetalol, but was experiencing dizziness. -She states she now takes Procardia daily. She reports resolution of dizziness.  Preterm labor symptoms and general obstetric precautions including but not limited to vaginal bleeding, contractions, leaking of fluid and fetal movement were reviewed in detail with the patient. I discussed the assessment and treatment plan with the patient. The patient was provided an opportunity to ask questions and all were answered. The patient agreed with the plan and demonstrated an understanding of the instructions. The patient was advised to call back or seek an in-person office evaluation/go to [facility]U at [facility] for any urgent or concerning symptoms. Please refer to After Visit Summary for other counseling recommendations.   I provided 8 minutes of face-to-face time during this encounter.  No follow-ups on file.  Future Appointments  Date Time Provider Department Center  12/23/2022  2:30 PM [facility] NURSE [facility]   12/23/2022  2:45 PM [facility] US5 [facility]  12/30/2022  3:00 PM [facility] NURSE [facility]  12/30/2022  3:15 PM [facility] NST [facility]  01/05/2023  1:50 PM Celedonio Savage, MD [facility] None  01/06/2023  3:00 PM [facility] NURSE [facility]  01/06/2023  3:15 PM [facility] NST [facility]  01/13/2023  3:00 PM [facility] NURSE [facility]  01/13/2023  3:15 PM [facility] NST [facility]  01/18/2023  1:50 PM Brock Bad, MD [facility] None  01/20/2023  3:30 PM [facility] NURSE [facility]  01/20/2023  3:45 PM [facility] US5 [facility]  01/25/2023  1:50 PM Anyanwu, Jethro Bastos, MD [facility] None  01/27/2023  3:00 PM [facility] NURSE [facility]  01/27/2023  3:15 PM [facility] NST [facility]  02/01/2023  1:50 PM Lennart Pall, MD [facility] None  02/03/2023  9:30 AM [facility] NURSE [facility]  02/03/2023  9:45 AM [facility] NST [facility]  02/08/2023  1:50 PM Corlis Hove, NP [facility] None  02/15/2023  1:50 PM  Sue Lush, FNP [facility] None    Cherre Robins, CNM [facility] [employer], [facility]

## 2022-12-22 NOTE — Progress Notes (Signed)
S/w pt for virtual ROB visit. Pt unable to check BP. No concerns.

## 2022-12-22 NOTE — Patient Instructions (Signed)
   PREGNANCY SUPPORT BELT: You are not alone, Seventy-five percent of women have some sort of abdominal or back pain at some point in their pregnancy. Your baby is growing at a fast pace, which means that your whole body is rapidly trying to adjust to the changes. As your uterus grows, your back may start feeling a bit under stress and this can result in back or abdominal pain that can go from mild, and therefore bearable, to severe pains that will not allow you to sit or lay down comfortably, When it comes to dealing with pregnancy-related pains and cramps, some pregnant women usually prefer natural remedies, which the market is filled with nowadays. For example, wearing a pregnancy support belt can help ease and lessen your discomfort and pain. WHAT ARE THE BENEFITS OF WEARING A PREGNANCY SUPPORT BELT? A pregnancy support belt provides support to the lower portion of the belly taking some of the weight of the growing uterus and distributing to the other parts of your body. It is designed make you comfortable and gives you extra support. Over the years, the pregnancy apparel market has been studying the needs and wants of pregnant women and they have come up with the most comfortable pregnancy support belts that woman could ever ask for. In fact, you will no longer have to wear a stretched-out or bulky pregnancy belt that is visible underneath your clothes and makes you feel even more uncomfortable. Nowadays, a pregnancy support belt is made of comfortable and stretchy materials that will not irritate your skin but will actually make you feel at ease and you will not even notice you are wearing it. They are easy to put on and adjust during the day and can be worn at night for additional support.  BENEFITS: Relives Back pain Relieves Abdominal Muscle and Leg Pain Stabilizes the Pelvic Ring Offers a Cushioned Abdominal Lift Pad Relieves pressure on the Sciatic Nerve Within Minutes    Locations that Carry  Maternity/Pregnancy Support Belts  You can find belts on [employer] starting at $10-$15.  2.  The [facility] carries belts for $10-$15.        Address/Phone: [address], [address], [place], [place] [address], [phone]  3.  You may be able to file your insurance with [email or web address] and have a belt mailed to you.  If you have any problems getting the belt, let your [facility] office know.  

## 2022-12-23 ENCOUNTER — Ambulatory Visit: Payer: Medicaid Other

## 2022-12-23 ENCOUNTER — Ambulatory Visit: Payer: Medicaid Other | Attending: Maternal & Fetal Medicine

## 2022-12-30 ENCOUNTER — Ambulatory Visit: Payer: Medicaid Other | Admitting: *Deleted

## 2022-12-30 ENCOUNTER — Ambulatory Visit: Payer: Medicaid Other | Attending: Maternal & Fetal Medicine | Admitting: *Deleted

## 2022-12-30 VITALS — BP 120/78 | HR 108

## 2022-12-30 DIAGNOSIS — O10913 Unspecified pre-existing hypertension complicating pregnancy, third trimester: Secondary | ICD-10-CM

## 2022-12-30 DIAGNOSIS — Z3480 Encounter for supervision of other normal pregnancy, unspecified trimester: Secondary | ICD-10-CM

## 2022-12-30 DIAGNOSIS — Z3A32 32 weeks gestation of pregnancy: Secondary | ICD-10-CM | POA: Insufficient documentation

## 2022-12-30 DIAGNOSIS — O26893 Other specified pregnancy related conditions, third trimester: Secondary | ICD-10-CM | POA: Insufficient documentation

## 2022-12-30 NOTE — Procedures (Signed)
Sarah Collier 15-Sep-1990 [redacted]w[redacted]d  Fetus A Non-Stress Test Interpretation for 12/30/22  Indication: Chronic Hypertenstion  Fetal Heart Rate A Mode: External Baseline Rate (A): 140 bpm Variability: Moderate Accelerations: 15 x 15 Decelerations: None Multiple birth?: No  Uterine Activity Mode: Toco Contraction Frequency (min): none Resting Tone Palpated: Relaxed  Interpretation (Fetal Testing) Nonstress Test Interpretation: Reactive Overall Impression: Reassuring for gestational age Comments: Tracing reviewed byDr. Parke Poisson

## 2023-01-05 ENCOUNTER — Ambulatory Visit (INDEPENDENT_AMBULATORY_CARE_PROVIDER_SITE_OTHER): Payer: Medicaid Other | Admitting: Family Medicine

## 2023-01-05 ENCOUNTER — Encounter: Payer: Self-pay | Admitting: Family Medicine

## 2023-01-05 VITALS — BP 114/87 | HR 91 | Wt 147.0 lb

## 2023-01-05 DIAGNOSIS — O10919 Unspecified pre-existing hypertension complicating pregnancy, unspecified trimester: Secondary | ICD-10-CM

## 2023-01-05 DIAGNOSIS — Z3A33 33 weeks gestation of pregnancy: Secondary | ICD-10-CM

## 2023-01-05 DIAGNOSIS — Z3480 Encounter for supervision of other normal pregnancy, unspecified trimester: Secondary | ICD-10-CM

## 2023-01-05 DIAGNOSIS — Z98891 History of uterine scar from previous surgery: Secondary | ICD-10-CM

## 2023-01-06 ENCOUNTER — Ambulatory Visit: Payer: Medicaid Other

## 2023-01-06 ENCOUNTER — Ambulatory Visit: Payer: Medicaid Other | Attending: Maternal & Fetal Medicine

## 2023-01-08 NOTE — Progress Notes (Signed)
   PRENATAL VISIT NOTE  Subjective:  Sarah Collier is a 32 y.o. G4P1021 at [redacted]w[redacted]d being seen today for ongoing prenatal care.  She is currently monitored for the following issues for this high-risk pregnancy and has Mild tetrahydrocannabinol (THC) abuse; Chronic hypertension during pregnancy, antepartum; Supervision of other normal pregnancy, antepartum; and History of cesarean section on their problem list.  Patient reports no complaints.  Contractions: Not present. Vag. Bleeding: None.  Movement: Present. Denies leaking of fluid.   The following portions of the patient's history were reviewed and updated as appropriate: allergies, current medications, past family history, past medical history, past social history, past surgical history and problem list.   Objective:   Vitals:   01/05/23 1407  BP: 114/87  Pulse: 91  Weight: 147 lb (66.7 kg)    Fetal Status: Fetal Heart Rate (bpm): 145   Movement: Present     General:  Alert, oriented and cooperative. Patient is in no acute distress.  Skin: Skin is warm and dry. No rash noted.   Cardiovascular: Normal heart rate noted  Respiratory: Normal respiratory effort, no problems with respiration noted  Abdomen: Soft, gravid, appropriate for gestational age.  Pain/Pressure: Present     Pelvic: Cervical exam deferred        Extremities: Normal range of motion.  Edema: None  Mental Status: Normal mood and affect. Normal behavior. Normal judgment and thought content.   Assessment and Plan:  Pregnancy: G4P1021 at [redacted]w[redacted]d 1. Supervision of other normal pregnancy, antepartum Continue routine prenatal care  2. History of cesarean section Desires TOLAC  3. Chronic hypertension during pregnancy, antepartum BP well-controlled on Procardia.  Continue Procardia.  No signs of preeclampsia  4. [redacted] weeks gestation of pregnancy   Preterm labor symptoms and general obstetric precautions including but not limited to vaginal bleeding, contractions,  leaking of fluid and fetal movement were reviewed in detail with the patient. Please refer to After Visit Summary for other counseling recommendations.   No follow-ups on file.  Future Appointments  Date Time Provider Department Center  01/13/2023  3:00 PM [facility] NURSE [facility]  01/13/2023  3:15 PM [facility] NST [facility]  01/18/2023  1:50 PM Brock Bad, MD [facility] None  01/20/2023  3:30 PM [facility] NURSE [facility]  01/20/2023  3:45 PM [facility] US5 [facility]  01/25/2023  1:50 PM Anyanwu, Jethro Bastos, MD [facility] None  01/27/2023  3:00 PM [facility] NURSE [facility]  01/27/2023  3:15 PM [facility] NST [facility]  02/01/2023  1:50 PM Lennart Pall, MD [facility] None  02/03/2023  9:30 AM [facility] NURSE [facility]  02/03/2023  9:45 AM [facility] NST [facility]  02/08/2023  1:50 PM Corlis Hove, NP [facility] None  02/15/2023  1:50 PM Sue Lush, FNP [facility] None    Celedonio Savage, MD

## 2023-01-13 ENCOUNTER — Ambulatory Visit: Payer: Medicaid Other

## 2023-01-13 ENCOUNTER — Ambulatory Visit: Payer: Medicaid Other | Attending: Maternal & Fetal Medicine

## 2023-01-18 ENCOUNTER — Encounter: Payer: Medicaid Other | Admitting: Obstetrics

## 2023-01-20 ENCOUNTER — Ambulatory Visit: Payer: Medicaid Other | Attending: Maternal & Fetal Medicine

## 2023-01-20 ENCOUNTER — Ambulatory Visit: Payer: Medicaid Other

## 2023-01-25 ENCOUNTER — Other Ambulatory Visit (HOSPITAL_COMMUNITY)
Admission: RE | Admit: 2023-01-25 | Discharge: 2023-01-25 | Disposition: A | Discharge status: Home / Self Care | Payer: Medicaid Other | Source: Ambulatory Visit | Attending: Obstetrics & Gynecology | Admitting: Obstetrics & Gynecology

## 2023-01-25 ENCOUNTER — Ambulatory Visit (INDEPENDENT_AMBULATORY_CARE_PROVIDER_SITE_OTHER): Payer: Medicaid Other | Admitting: Obstetrics & Gynecology

## 2023-01-25 VITALS — BP 127/83 | HR 91 | Wt 149.0 lb

## 2023-01-25 DIAGNOSIS — O23593 Infection of other part of genital tract in pregnancy, third trimester: Secondary | ICD-10-CM

## 2023-01-25 DIAGNOSIS — O0933 Supervision of pregnancy with insufficient antenatal care, third trimester: Secondary | ICD-10-CM | POA: Insufficient documentation

## 2023-01-25 DIAGNOSIS — O0993 Supervision of high risk pregnancy, unspecified, third trimester: Secondary | ICD-10-CM

## 2023-01-25 DIAGNOSIS — N760 Acute vaginitis: Secondary | ICD-10-CM

## 2023-01-25 DIAGNOSIS — O10913 Unspecified pre-existing hypertension complicating pregnancy, third trimester: Secondary | ICD-10-CM

## 2023-01-25 DIAGNOSIS — Z3A36 36 weeks gestation of pregnancy: Secondary | ICD-10-CM | POA: Insufficient documentation

## 2023-01-25 DIAGNOSIS — O10919 Unspecified pre-existing hypertension complicating pregnancy, unspecified trimester: Secondary | ICD-10-CM

## 2023-01-25 DIAGNOSIS — Z98891 History of uterine scar from previous surgery: Secondary | ICD-10-CM

## 2023-01-25 NOTE — Progress Notes (Signed)
   PRENATAL VISIT NOTE  Subjective:  Sarah Collier is a 32 y.o. G4P1021 at [redacted]w[redacted]d being seen today for ongoing prenatal care.  She is currently monitored for the following issues for this high-risk pregnancy and has Mild tetrahydrocannabinol (THC) abuse; Chronic hypertension during pregnancy, antepartum; Supervision of high-risk pregnancy; History of cesarean section; and Insufficient prenatal care in third trimester on their problem list.  Patient reports  episode of bleeding after intercourse recently, no other concerns . Patient denies any headaches, visual symptoms, RUQ/epigastric pain or other concerning symptoms.  Contractions: Irregular. Vag. Bleeding: None.  Movement: Present. Denies leaking of fluid.  Of note, has missed several office and MFM appointments. She missed antenatal screening appointments.   The following portions of the patient's history were reviewed and updated as appropriate: allergies, current medications, past family history, past medical history, past social history, past surgical history and problem list.   Objective:   Vitals:   01/25/23 1355  BP: 127/83  Pulse: 91  Weight: 149 lb (67.6 kg)    Fetal Status: Fetal Heart Rate (bpm): 140 Fundal Height: 36 cm Movement: Present  Presentation: Vertex  General:  Alert, oriented and cooperative. Patient is in no acute distress.  Skin: Skin is warm and dry. No rash noted.   Cardiovascular: Normal heart rate noted  Respiratory: Normal respiratory effort, no problems with respiration noted  Abdomen: Soft, gravid, appropriate for gestational age.  Pain/Pressure: Present     Pelvic: Cervical exam performed in the presence of a chaperone Dilation: 1 Effacement (%): 50 Station: -3. Cultures obtained.  Extremities: Normal range of motion.  Edema: None  Mental Status: Normal mood and affect. Normal behavior. Normal judgment and thought content.   Assessment and Plan:  Pregnancy: G4P1021 at [redacted]w[redacted]d 1. Chronic hypertension  during pregnancy, antepartum 2. Insufficient prenatal care in third trimester Stable BP on Nifedipine 30 mg every day.  No concerning symptoms. Discussed need for weekly antenatal testing, growth scans. Last growth scan was on 11/25/22, unable to schedule another one with MFM until after 02/06/23. Recommended IOL at 37-38 weeks, she desires IOL at 38 weeks. This was scheduled on 02/04/23 midnight, orders signed and held.  Emphasized importance of coming for her next office appointment on 02/01/23 (can get NST and AFI that visit if possible), then followed by her IOL on 02/04/23. - NST performed today was reviewed and was found to be reactive.  Continue recommended antenatal testing and prenatal care.  3. History of cesarean section TOLAC consent signed 10/26/22  4. [redacted] weeks gestation of pregnancy 5. Supervision of high risk pregnancy in third trimester Pelvic cultures done, will follow up results and manage accordingly. - Culture, beta strep (group b only) - Cervicovaginal ancillary only( [place]) Labor symptoms and general obstetric precautions including but not limited to vaginal bleeding, contractions, leaking of fluid and fetal movement were reviewed in detail with the patient. Please refer to After Visit Summary for other counseling recommendations.   Return in about 1 week (around 02/01/2023) for OFFICE OB VISIT (MD only) as scheduled and NST (AFI if possible).  Future Appointments  Date Time Provider Department Center  02/01/2023  1:50 PM Lennart Pall, MD [facility] None  02/04/2023 12:00 AM [facility] SCHED ROOM [facility] None  02/08/2023  1:50 PM Corlis Hove, NP [facility] None  02/15/2023  1:50 PM Sue Lush, FNP [facility] None    Jaynie Collins, MD

## 2023-01-26 ENCOUNTER — Encounter (HOSPITAL_COMMUNITY): Payer: Self-pay | Admitting: *Deleted

## 2023-01-26 ENCOUNTER — Telehealth (HOSPITAL_COMMUNITY): Payer: Self-pay | Admitting: *Deleted

## 2023-01-26 LAB — CERVICOVAGINAL ANCILLARY ONLY
Bacterial Vaginitis (gardnerella): POSITIVE — AB
Candida Glabrata: NEGATIVE
Candida Vaginitis: POSITIVE — AB
Chlamydia: NEGATIVE
Comment: NEGATIVE
Comment: NEGATIVE
Comment: NEGATIVE
Comment: NEGATIVE
Comment: NEGATIVE
Comment: NORMAL
Neisseria Gonorrhea: NEGATIVE
Trichomonas: NEGATIVE

## 2023-01-26 MED ORDER — METRONIDAZOLE 500 MG PO TABS
500.0000 mg | ORAL_TABLET | Freq: Two times a day (BID) | ORAL | 0 refills | Status: DC
Start: 2023-01-26 — End: 2023-01-30

## 2023-01-26 MED ORDER — FLUCONAZOLE 150 MG PO TABS
150.0000 mg | ORAL_TABLET | Freq: Once | ORAL | 0 refills | Status: DC
Start: 2023-01-26 — End: 2023-01-30

## 2023-01-26 NOTE — Telephone Encounter (Signed)
Preadmission screen  

## 2023-01-26 NOTE — Addendum Note (Signed)
Addended by: Jaynie Collins A on: 01/26/2023 12:33 PM   Modules accepted: Orders

## 2023-01-27 ENCOUNTER — Ambulatory Visit: Payer: Medicaid Other

## 2023-01-28 ENCOUNTER — Other Ambulatory Visit: Payer: Self-pay | Admitting: Advanced Practice Midwife

## 2023-01-28 ENCOUNTER — Encounter: Payer: Self-pay | Admitting: Obstetrics & Gynecology

## 2023-01-28 DIAGNOSIS — O99820 Streptococcus B carrier state complicating pregnancy: Secondary | ICD-10-CM | POA: Insufficient documentation

## 2023-01-28 LAB — CULTURE, BETA STREP (GROUP B ONLY): Strep Gp B Culture: POSITIVE — AB

## 2023-01-30 ENCOUNTER — Encounter: Payer: Self-pay | Admitting: Obstetrics & Gynecology

## 2023-01-30 ENCOUNTER — Other Ambulatory Visit: Payer: Self-pay | Admitting: Advanced Practice Midwife

## 2023-01-30 ENCOUNTER — Other Ambulatory Visit: Payer: Self-pay | Admitting: Emergency Medicine

## 2023-01-30 DIAGNOSIS — N760 Acute vaginitis: Secondary | ICD-10-CM

## 2023-01-30 MED ORDER — METRONIDAZOLE 500 MG PO TABS
500.0000 mg | ORAL_TABLET | Freq: Two times a day (BID) | ORAL | 0 refills | Status: AC
Start: 2023-01-30 — End: 2023-02-06

## 2023-01-30 MED ORDER — FLUCONAZOLE 150 MG PO TABS
150.0000 mg | ORAL_TABLET | Freq: Once | ORAL | 0 refills | Status: AC
Start: 2023-01-30 — End: 2023-01-30

## 2023-01-30 NOTE — Progress Notes (Signed)
Rx resent to pharmacy

## 2023-02-01 ENCOUNTER — Encounter: Payer: Medicaid Other | Admitting: Student

## 2023-02-01 NOTE — Progress Notes (Signed)
Error

## 2023-02-02 ENCOUNTER — Other Ambulatory Visit (HOSPITAL_COMMUNITY): Payer: Self-pay | Admitting: Advanced Practice Midwife

## 2023-02-03 ENCOUNTER — Ambulatory Visit: Payer: Medicaid Other

## 2023-02-04 ENCOUNTER — Encounter (HOSPITAL_COMMUNITY): Payer: Self-pay | Admitting: Obstetrics & Gynecology

## 2023-02-04 ENCOUNTER — Inpatient Hospital Stay (HOSPITAL_COMMUNITY): Payer: Medicaid Other | Admitting: Anesthesiology

## 2023-02-04 ENCOUNTER — Inpatient Hospital Stay (HOSPITAL_COMMUNITY): Payer: Medicaid Other

## 2023-02-04 ENCOUNTER — Inpatient Hospital Stay (HOSPITAL_COMMUNITY)
Admission: RE | Admit: 2023-02-04 | Discharge: 2023-02-06 | DRG: 806 | Disposition: A | Discharge status: Home / Self Care | Payer: Medicaid Other | Attending: Obstetrics & Gynecology | Admitting: Obstetrics & Gynecology

## 2023-02-04 DIAGNOSIS — O1092 Unspecified pre-existing hypertension complicating childbirth: Principal | ICD-10-CM | POA: Diagnosis present

## 2023-02-04 DIAGNOSIS — O0933 Supervision of pregnancy with insufficient antenatal care, third trimester: Secondary | ICD-10-CM

## 2023-02-04 DIAGNOSIS — O99324 Drug use complicating childbirth: Secondary | ICD-10-CM | POA: Diagnosis present

## 2023-02-04 DIAGNOSIS — O99820 Streptococcus B carrier state complicating pregnancy: Secondary | ICD-10-CM

## 2023-02-04 DIAGNOSIS — Z98891 History of uterine scar from previous surgery: Secondary | ICD-10-CM

## 2023-02-04 DIAGNOSIS — O1002 Pre-existing essential hypertension complicating childbirth: Secondary | ICD-10-CM | POA: Diagnosis not present

## 2023-02-04 DIAGNOSIS — O0990 Supervision of high risk pregnancy, unspecified, unspecified trimester: Secondary | ICD-10-CM

## 2023-02-04 DIAGNOSIS — Z87891 Personal history of nicotine dependence: Secondary | ICD-10-CM

## 2023-02-04 DIAGNOSIS — F1210 Cannabis abuse, uncomplicated: Secondary | ICD-10-CM | POA: Diagnosis present

## 2023-02-04 DIAGNOSIS — Z3A38 38 weeks gestation of pregnancy: Secondary | ICD-10-CM

## 2023-02-04 DIAGNOSIS — O34211 Maternal care for low transverse scar from previous cesarean delivery: Secondary | ICD-10-CM | POA: Diagnosis not present

## 2023-02-04 DIAGNOSIS — O34219 Maternal care for unspecified type scar from previous cesarean delivery: Secondary | ICD-10-CM | POA: Diagnosis present

## 2023-02-04 DIAGNOSIS — O10919 Unspecified pre-existing hypertension complicating pregnancy, unspecified trimester: Principal | ICD-10-CM | POA: Diagnosis present

## 2023-02-04 DIAGNOSIS — O99824 Streptococcus B carrier state complicating childbirth: Secondary | ICD-10-CM | POA: Diagnosis present

## 2023-02-04 LAB — TYPE AND SCREEN
ABO/RH(D): AB POS
Antibody Screen: NEGATIVE

## 2023-02-04 LAB — CBC
HCT: 33.1 % — ABNORMAL LOW (ref 36.0–46.0)
HCT: 32.8 % — ABNORMAL LOW (ref 36.0–46.0)
Hemoglobin: 10.7 g/dL — ABNORMAL LOW (ref 12.0–15.0)
Hemoglobin: 10.3 g/dL — ABNORMAL LOW (ref 12.0–15.0)
MCH: 28.3 pg (ref 26.0–34.0)
MCH: 27.8 pg (ref 26.0–34.0)
MCHC: 32.3 g/dL (ref 30.0–36.0)
MCHC: 31.4 g/dL (ref 30.0–36.0)
MCV: 87.6 fL (ref 80.0–100.0)
MCV: 88.4 fL (ref 80.0–100.0)
Platelets: 286 10*3/uL (ref 150–400)
Platelets: 271 10*3/uL (ref 150–400)
RBC: 3.78 MIL/uL — ABNORMAL LOW (ref 3.87–5.11)
RBC: 3.71 MIL/uL — ABNORMAL LOW (ref 3.87–5.11)
RDW: 12.8 % (ref 11.5–15.5)
RDW: 12.8 % (ref 11.5–15.5)
WBC: 10 10*3/uL (ref 4.0–10.5)
WBC: 12.9 10*3/uL — ABNORMAL HIGH (ref 4.0–10.5)
nRBC: 0 % (ref 0.0–0.2)
nRBC: 0 % (ref 0.0–0.2)

## 2023-02-04 LAB — COMPREHENSIVE METABOLIC PANEL
ALT: 12 U/L (ref 0–44)
AST: 18 U/L (ref 15–41)
Albumin: 2.9 g/dL — ABNORMAL LOW (ref 3.5–5.0)
Alkaline Phosphatase: 141 U/L — ABNORMAL HIGH (ref 38–126)
Anion gap: 11 (ref 5–15)
BUN: 6 mg/dL (ref 6–20)
CO2: 22 mmol/L (ref 22–32)
Calcium: 8.7 mg/dL — ABNORMAL LOW (ref 8.9–10.3)
Chloride: 103 mmol/L (ref 98–111)
Creatinine, Ser: 0.81 mg/dL (ref 0.44–1.00)
GFR, Estimated: >60 mL/min (ref >60)
Glucose, Bld: 80 mg/dL (ref 70–99)
Potassium: 3.3 mmol/L — ABNORMAL LOW (ref 3.5–5.1)
Sodium: 136 mmol/L (ref 135–145)
Total Bilirubin: 0.6 mg/dL (ref 0.3–1.2)
Total Protein: 6.8 g/dL (ref 6.5–8.1)

## 2023-02-04 LAB — PROTEIN / CREATININE RATIO, URINE
Creatinine, Urine: 205 mg/dL
Protein Creatinine Ratio: 0.07 mg/mg{Cre} (ref 0.00–0.15)
Total Protein, Urine: 14 mg/dL

## 2023-02-04 LAB — RPR: RPR Ser Ql: NONREACTIVE

## 2023-02-04 MED ORDER — TERBUTALINE SULFATE 1 MG/ML IJ SOLN: 0.2500 mg | Freq: Once | INTRAMUSCULAR | Status: DC | PRN

## 2023-02-04 MED ORDER — HYDROXYZINE HCL 50 MG PO TABS: 50.0000 mg | ORAL_TABLET | Freq: Four times a day (QID) | ORAL | Status: DC | PRN

## 2023-02-04 MED ORDER — LABETALOL HCL 5 MG/ML IV SOLN: 80.0000 mg | INTRAVENOUS | Status: DC | PRN

## 2023-02-04 MED ORDER — FENTANYL CITRATE (PF) 100 MCG/2ML IJ SOLN
50.0000 ug | INTRAMUSCULAR | Status: DC | PRN
  Filled 2023-02-04: qty 2

## 2023-02-04 MED ORDER — SENNOSIDES-DOCUSATE SODIUM 8.6-50 MG PO TABS
2.0000 | ORAL_TABLET | ORAL | Status: DC
  Administered 2023-02-05 – 2023-02-06 (×2): 2 via ORAL
  Filled 2023-02-04 (×3): qty 2

## 2023-02-04 MED ORDER — FERROUS SULFATE 325 (65 FE) MG PO TABS
325.0000 mg | ORAL_TABLET | ORAL | Status: DC
  Administered 2023-02-05: 325 mg via ORAL
  Filled 2023-02-04: qty 1

## 2023-02-04 MED ORDER — ACETAMINOPHEN 325 MG PO TABS: 650.0000 mg | ORAL_TABLET | ORAL | Status: DC | PRN

## 2023-02-04 MED ORDER — LABETALOL HCL 5 MG/ML IV SOLN: 20.0000 mg | INTRAVENOUS | Status: DC | PRN

## 2023-02-04 MED ORDER — LIDOCAINE HCL (PF) 1 % IJ SOLN
INTRAMUSCULAR | Status: DC | PRN
  Administered 2023-02-04: 5 mL via EPIDURAL

## 2023-02-04 MED ORDER — SODIUM CHLORIDE 0.9 % IV SOLN
5.0000 10*6.[IU] | Freq: Once | INTRAVENOUS | Status: AC
  Administered 2023-02-04: 5 10*6.[IU] via INTRAVENOUS
  Filled 2023-02-04: qty 5

## 2023-02-04 MED ORDER — LIDOCAINE HCL (PF) 1 % IJ SOLN: 30.0000 mL | INTRAMUSCULAR | Status: DC | PRN

## 2023-02-04 MED ORDER — DIPHENHYDRAMINE HCL 25 MG PO CAPS: 25.0000 mg | ORAL_CAPSULE | Freq: Four times a day (QID) | ORAL | Status: DC | PRN

## 2023-02-04 MED ORDER — BENZOCAINE-MENTHOL 20-0.5 % EX AERO
1.0000 | INHALATION_SPRAY | CUTANEOUS | Status: DC | PRN
  Administered 2023-02-05: 1 via TOPICAL
  Filled 2023-02-04: qty 56

## 2023-02-04 MED ORDER — ZOLPIDEM TARTRATE 5 MG PO TABS: 5.0000 mg | ORAL_TABLET | Freq: Every evening | ORAL | Status: DC | PRN

## 2023-02-04 MED ORDER — ONDANSETRON HCL 4 MG/2ML IJ SOLN: 4.0000 mg | INTRAMUSCULAR | Status: DC | PRN

## 2023-02-04 MED ORDER — LACTATED RINGERS IV SOLN: 500.0000 mL | INTRAVENOUS | Status: DC | PRN

## 2023-02-04 MED ORDER — FLEET ENEMA 7-19 GM/118ML RE ENEM: 1.0000 | ENEMA | Freq: Every day | RECTAL | Status: DC | PRN

## 2023-02-04 MED ORDER — OXYCODONE-ACETAMINOPHEN 5-325 MG PO TABS: 2.0000 | ORAL_TABLET | ORAL | Status: DC | PRN

## 2023-02-04 MED ORDER — EPHEDRINE 5 MG/ML INJ: 10.0000 mg | INTRAVENOUS | Status: DC | PRN

## 2023-02-04 MED ORDER — DIPHENHYDRAMINE HCL 50 MG/ML IJ SOLN: 12.5000 mg | INTRAMUSCULAR | Status: DC | PRN

## 2023-02-04 MED ORDER — COCONUT OIL OIL: 1.0000 | TOPICAL_OIL | Status: DC | PRN

## 2023-02-04 MED ORDER — ONDANSETRON HCL 4 MG/2ML IJ SOLN: 4.0000 mg | Freq: Four times a day (QID) | INTRAMUSCULAR | Status: DC | PRN

## 2023-02-04 MED ORDER — DIBUCAINE (PERIANAL) 1 % EX OINT: 1.0000 | TOPICAL_OINTMENT | CUTANEOUS | Status: DC | PRN

## 2023-02-04 MED ORDER — PHENYLEPHRINE 80 MCG/ML (10ML) SYRINGE FOR IV PUSH (FOR BLOOD PRESSURE SUPPORT): 80.0000 ug | PREFILLED_SYRINGE | INTRAVENOUS | Status: DC | PRN

## 2023-02-04 MED ORDER — LACTATED RINGERS IV SOLN: 500.0000 mL | Freq: Once | INTRAVENOUS | Status: DC

## 2023-02-04 MED ORDER — WITCH HAZEL-GLYCERIN EX PADS: 1.0000 | MEDICATED_PAD | CUTANEOUS | Status: DC | PRN

## 2023-02-04 MED ORDER — METHYLERGONOVINE MALEATE 0.2 MG PO TABS: 0.2000 mg | ORAL_TABLET | ORAL | Status: DC | PRN

## 2023-02-04 MED ORDER — MEASLES, MUMPS & RUBELLA VAC IJ SOLR: 0.5000 mL | Freq: Once | INTRAMUSCULAR | Status: DC

## 2023-02-04 MED ORDER — OXYCODONE-ACETAMINOPHEN 5-325 MG PO TABS: 1.0000 | ORAL_TABLET | ORAL | Status: DC | PRN

## 2023-02-04 MED ORDER — IBUPROFEN 600 MG PO TABS
600.0000 mg | ORAL_TABLET | Freq: Four times a day (QID) | ORAL | Status: DC
  Administered 2023-02-04 – 2023-02-06 (×7): 600 mg via ORAL
  Filled 2023-02-04 (×7): qty 1

## 2023-02-04 MED ORDER — MEDROXYPROGESTERONE ACETATE 150 MG/ML IM SUSP: 150.0000 mg | INTRAMUSCULAR | Status: DC | PRN

## 2023-02-04 MED ORDER — SOD CITRATE-CITRIC ACID 500-334 MG/5ML PO SOLN: 30.0000 mL | ORAL | Status: DC | PRN

## 2023-02-04 MED ORDER — SIMETHICONE 80 MG PO CHEW: 80.0000 mg | CHEWABLE_TABLET | ORAL | Status: DC | PRN

## 2023-02-04 MED ORDER — NIFEDIPINE ER OSMOTIC RELEASE 30 MG PO TB24
30.0000 mg | ORAL_TABLET | Freq: Every day | ORAL | Status: DC
  Filled 2023-02-04: qty 1

## 2023-02-04 MED ORDER — PENICILLIN G POT IN DEXTROSE 60000 UNIT/ML IV SOLN
3.0000 10*6.[IU] | INTRAVENOUS | Status: DC
  Administered 2023-02-04 (×3): 3 10*6.[IU] via INTRAVENOUS
  Filled 2023-02-04 (×3): qty 50

## 2023-02-04 MED ORDER — LABETALOL HCL 5 MG/ML IV SOLN: 40.0000 mg | INTRAVENOUS | Status: DC | PRN

## 2023-02-04 MED ORDER — METHYLERGONOVINE MALEATE 0.2 MG/ML IJ SOLN: 0.2000 mg | INTRAMUSCULAR | Status: DC | PRN

## 2023-02-04 MED ORDER — PRENATAL MULTIVITAMIN CH
1.0000 | ORAL_TABLET | Freq: Every day | ORAL | Status: DC
  Administered 2023-02-05 – 2023-02-06 (×2): 1 via ORAL
  Filled 2023-02-04 (×2): qty 1

## 2023-02-04 MED ORDER — OXYTOCIN BOLUS FROM INFUSION: 333.0000 mL | Freq: Once | INTRAVENOUS | Status: DC

## 2023-02-04 MED ORDER — FENTANYL-BUPIVACAINE-NACL 0.5-0.125-0.9 MG/250ML-% EP SOLN: 12.0000 mL/h | EPIDURAL | Status: DC | PRN

## 2023-02-04 MED ORDER — OXYTOCIN-SODIUM CHLORIDE 30-0.9 UT/500ML-% IV SOLN: 2.5000 [IU]/h | INTRAVENOUS | Status: DC

## 2023-02-04 MED ORDER — BISACODYL 10 MG RE SUPP: 10.0000 mg | Freq: Every day | RECTAL | Status: DC | PRN

## 2023-02-04 MED ORDER — OXYTOCIN-SODIUM CHLORIDE 30-0.9 UT/500ML-% IV SOLN
1.0000 m[IU]/min | INTRAVENOUS | Status: DC
  Administered 2023-02-04: 2 m[IU]/min via INTRAVENOUS
  Filled 2023-02-04: qty 500

## 2023-02-04 MED ORDER — HYDRALAZINE HCL 20 MG/ML IJ SOLN: 10.0000 mg | INTRAMUSCULAR | Status: DC | PRN

## 2023-02-04 MED ORDER — FENTANYL-BUPIVACAINE-NACL 0.5-0.125-0.9 MG/250ML-% EP SOLN
EPIDURAL | Status: DC | PRN
  Administered 2023-02-04: 12 mL/h via EPIDURAL

## 2023-02-04 MED ORDER — TETANUS-DIPHTH-ACELL PERTUSSIS 5-2.5-18.5 LF-MCG/0.5 IM SUSY: 0.5000 mL | PREFILLED_SYRINGE | Freq: Once | INTRAMUSCULAR | Status: DC

## 2023-02-04 MED ORDER — LACTATED RINGERS IV SOLN: INTRAVENOUS | Status: DC

## 2023-02-04 MED ORDER — ONDANSETRON HCL 4 MG PO TABS: 4.0000 mg | ORAL_TABLET | ORAL | Status: DC | PRN

## 2023-02-04 NOTE — Progress Notes (Signed)
Labor Progress Note ALAETRA TIEFENTHALER is a 32 y.o. Z6X0960 at [redacted]w[redacted]d presented for IOL 2/2 cHTN.  S: Having some discomfort with contractions.   O:  BP 111/82   Pulse 78   Temp 98.1 F (36.7 C) (Oral)   Resp 19   Ht 4\' 11"  (1.499 m)   Wt 68.6 kg   LMP 05/14/2022   BMI 30.54 kg/m  EFM: 135bpm/moderate/+accels, no decels  CVE: Dilation: Fingertip Effacement (%): Thick Station: -3 Presentation: Vertex Exam by:: Esther Hardy RN   A&P: 32 y.o. [phone] [redacted]w[redacted]d here for IOL 2/2 cHTN #Labor: Progressing well. Offered AROM. She would like to wait until her next exam.  #Pain: maternal support #FWB: Cat I  #GBS positive  cHTN BP wnl. -Continue procardia  Mayumi Summerson Autry-Lott, DO 8:59 AM

## 2023-02-04 NOTE — H&P (Addendum)
OBSTETRIC ADMISSION HISTORY AND PHYSICAL  Sarah Collier is a 32 y.o. female [phone] with IUP at [redacted]w[redacted]d by LMP presenting for TOLAC IOL due to [place]. She reports +FMs, No LOF, no VB, no blurry vision, headaches or peripheral edema, and RUQ pain.  She plans on bottle feeding. She declined birth control. She received her prenatal care at [facility]   Dating: By LMP --->  Estimated Date of Delivery: 02/18/23  Sono:    @[redacted]w[redacted]d , normal anatomy, cephalic presentation, 1051g, 45% EFW   Prenatal History/Complications: mild THC abuse, cHTN during pregnancy, insufficient prenatal care third trimester, hx cesarean desires TOLAC       Nursing Staff Provider  Office Location [facility] Dating  02/18/2023, by Last Menstrual Period, confirmed by first trimester U/S  [facility] Model [ X] Traditional [ ]  Centering [ ]  Mom-Baby Dyad Anatomy US   09/28/22, normal, f/u in 4 weeks to complete anatomy  Language  English      Flu Vaccine  Vaccinated Genetic/Carrier Screen  NIPS:   low risks  AFP:   negative Horizon: neg x4  TDaP Vaccine    Hgb A1C or  GTT Early 5.0 Third trimester normal 28 weeks  COVID Vaccine Vaccinated   LAB RESULTS   Rhogam  AB/Positive/-- (01/18 1639)  Blood Type AB/Positive/-- (01/18 1639)   Baby Feeding Plan Bottle Antibody Negative (01/18 1639)  Contraception Declined Rubella 1.92 (01/18 1639)  Circumcision Yes if a boy RPR Non Reactive (01/18 1639)   Pediatrician  [facility] HBsAg Negative (01/18 1639)   Support Person Mom  HCVAb Non Reactive (01/18 1639)   Prenatal Classes   HIV Non Reactive (01/18 1639)     BTL Consent   GBS  Positive   VBAC Consent  10/26/22 Pap       Diagnosis  Date Value Ref Range Status  05/31/2022     Final    - Negative for intraepithelial lesion or malignancy (NILM)             DME Rx Arly.Keller ] BP cuff Arly.Keller ] Weight Scale Waterbirth    PHQ9 & GAD7 [ X ] new OB [  ] 28 weeks  [  ] 36 weeks Induction  [ ]  Orders Entered [ ] Foley Y/N     Past Medical  History: Past Medical History:  Diagnosis Date   Hypertension 04/06/2017   Infection    gonorrhea, chlamydia   PID (acute pelvic inflammatory disease)    Pregnancy induced hypertension    S/P C-section 04/06/2017   LTCS due to Cat 3 strip 8/19  Progressed to 5 cm  VBAC consent 10/26/22    Past Surgical History: Past Surgical History:  Procedure Laterality Date   CESAREAN SECTION N/A 03/19/2017   Procedure: CESAREAN SECTION;  Surgeon: Adam Phenix, MD;  Location: [facility];  Service: Obstetrics;  Laterality: N/A;   THERAPEUTIC ABORTION     WISDOM TOOTH EXTRACTION      Obstetrical History: OB History     Gravida  4   Para  1   Term  1   Preterm      AB  2   Living  1      SAB      IAB  2   Ectopic      Multiple  0   Live Births  1           Social History Social History   Socioeconomic History   Marital status:  Single    Spouse name: Not on file   Number of children: Not on file   Years of education: Not on file   Highest education level: Not on file  Occupational History   Not on file  Tobacco Use   Smoking status: Former    Types: Cigars    Quit date: 06/2022    Years since quitting: 0.6   Smokeless tobacco: Never  Vaping Use   Vaping Use: Never used  Substance and Sexual Activity   Alcohol use: Not Currently    Comment: occ, not since confirmed pregnancy   Drug use: Not Currently    Types: Marijuana    Comment: 3-4 times per day   Sexual activity: Yes    Partners: Male    Birth control/protection: None  Other Topics Concern   Not on file  Social History Narrative   Not on file   Social Determinants of Health   Financial Resource Strain: Not on file  Food Insecurity: No Food Insecurity (02/04/2023)   Hunger Vital Sign    Worried About Running Out of Food in the Last Year: Never true    Ran Out of Food in the Last Year: Never true  Transportation Needs: No Transportation Needs (02/04/2023)   PRAPARE - Therapist, art (Medical): No    Lack of Transportation (Non-Medical): No  Physical Activity: Not on file  Stress: Not on file  Social Connections: Not on file    Family History: Family History  Problem Relation Age of Onset   Hypertension Mother    Asthma Father    Hypertension Father    Diabetes Maternal Grandmother    Hearing loss Neg Hx     Allergies: No Known Allergies  Medications Prior to Admission  Medication Sig Dispense Refill Last Dose   metroNIDAZOLE (FLAGYL) 500 MG tablet Take 1 tablet (500 mg total) by mouth 2 (two) times daily for 7 days. 14 tablet 0    NIFEdipine (PROCARDIA-XL/NIFEDICAL-XL) 30 MG 24 hr tablet Take 1 tablet (30 mg total) by mouth daily. 30 tablet 2    Prenat-Fe Poly-Methfol-FA-DHA (VITAFOL ULTRA) 29-0.6-0.4-200 MG CAPS Take 1 capsule by mouth daily. 30 capsule 11      Review of Systems   All systems reviewed and negative except as stated in HPI  Temperature 97.9 F (36.6 C), temperature source Oral, height 4\' 11"  (1.499 m), weight 68.6 kg, last menstrual period 05/14/2022, unknown if currently breastfeeding. General appearance: alert, cooperative, and no distress Lungs: clear to auscultation bilaterally Heart: regular rate and rhythm Abdomen: soft, non-tender; bowel sounds normal Extremities: no sign of DVT Presentation: cephalic Fetal monitoringBaseline: 150 bpm and Variability: Good {> 6 bpm) Uterine activityNone     Prenatal labs: ABO, Rh: AB/Positive/-- (01/18 1639) Antibody: Negative (01/18 1639) Rubella: 1.92 (01/18 1639) RPR: Non Reactive (04/26 0811)  HBsAg: Negative (01/18 1639)  HIV: Non Reactive (04/26 0811)  GBS: Positive/-- (06/26 1442)  1 hr Glucola nml Genetic screening  nml Anatomy US nml  Prenatal Transfer Tool  Maternal Diabetes: No Genetic Screening: Normal Maternal Ultrasounds/Referrals: Normal Fetal Ultrasounds or other Referrals:  Referred to Materal Fetal Medicine  Maternal Substance Abuse:   Yes:  Type: Marijuana Significant Maternal Medications:  Meds include: Other:  nifedipine Significant Maternal Lab Results:  Group B Strep positive Number of Prenatal Visits:Less than or equal to 3 verified prenatal visits Other Comments:  None  No results found for this or any previous visit (from the  past 24 hour(s)).  Patient Active Problem List   Diagnosis Date Noted   GBS (group B Streptococcus carrier), +RV culture, currently pregnant 01/28/2023   Insufficient prenatal care in third trimester 01/25/2023   History of cesarean section 11/25/2022   Supervision of high-risk pregnancy 07/19/2022   Chronic hypertension during pregnancy, antepartum 02/23/2017   Mild tetrahydrocannabinol (THC) abuse 09/26/2016    Assessment/Plan:  TARANIKA MARRA is a 32 y.o. G4P1021 at [redacted]w[redacted]d here for TOLAC IOL due to [place].   #Labor: declined foley bulb, will start pitocin 2x2 #Pain: Per pt request #FWB: Cat 1 #ID:  Gbs +- PCN started #MOF: bottle #MOC:declined #cHTN: continue nifedipine 30mg , PreE labs pending. CTM  [employer], DO  02/04/2023, 2:06 AM ___ GME ATTESTATION:  Evaluation and management procedures were performed by the [facility] Resident under my supervision. I was immediately available for direct supervision, assistance and direction throughout this encounter.  I also confirm that I have verified the information documented in the resident's note, and that I have also personally reperformed the pertinent components of the physical exam and all of the medical decision making activities.  I have also made any necessary editorial changes.  Myrtie Hawk, DO OB Fellow, Faculty [facility], Center for [facility] 02/04/2023 3:10 AM

## 2023-02-04 NOTE — Progress Notes (Signed)
First push with CNM at [facility], small crown noted.  Good movement with pushing

## 2023-02-04 NOTE — Progress Notes (Signed)
SVD baby boy skin to skin with mother 

## 2023-02-04 NOTE — Anesthesia Preprocedure Evaluation (Signed)
Anesthesia Evaluation  Patient identified by MRN, date of birth, ID band Patient awake    Reviewed: Allergy & Precautions, NPO status , Patient's Chart, lab work & pertinent test results  Airway Mallampati: II  TM Distance: >3 FB Neck ROM: Full    Dental no notable dental hx. (+) Teeth Intact, Dental Advisory Given   Pulmonary former smoker   Pulmonary exam normal breath sounds clear to auscultation       Cardiovascular hypertension (gHtn), Normal cardiovascular exam Rhythm:Regular Rate:Normal     Neuro/Psych    GI/Hepatic   Endo/Other    Renal/GU      Musculoskeletal   Abdominal   Peds  Hematology Lab Results      Component                Value               Date                      WBC                      10.0                02/04/2023                HGB                      10.7 (L)            02/04/2023                HCT                      33.1 (L)            02/04/2023                MCV                      87.6                02/04/2023                PLT                      286                 02/04/2023              Anesthesia Other Findings   Reproductive/Obstetrics (+) Pregnancy                             Anesthesia Physical Anesthesia Plan  ASA: 3  Anesthesia Plan: Epidural   Post-op Pain Management:    Induction:   PONV Risk Score and Plan:   Airway Management Planned:   Additional Equipment:   Intra-op Plan:   Post-operative Plan:   Informed Consent: I have reviewed the patients History and Physical, chart, labs and discussed the procedure including the risks, benefits and alternatives for the proposed anesthesia with the patient or authorized representative who has indicated his/her understanding and acceptance.       Plan Discussed with:   Anesthesia Plan Comments: (38 wk G4P1 w gHtn for LEA)       Anesthesia Quick Evaluation

## 2023-02-04 NOTE — Discharge Summary (Signed)
Postpartum Discharge Summary      Patient Name: Sarah Collier DOB: March 07, 1991 MRN: [id number]  Date of admission: 02/04/2023 Delivery date:02/04/2023  Delivering provider: Jacklyn Shell  Date of discharge: 02/06/2023  Admitting diagnosis: Chronic hypertension during pregnancy, antepartum [O10.919] Intrauterine pregnancy: [redacted]w[redacted]d     Secondary diagnosis:  Principal Problem:   Chronic hypertension during pregnancy, antepartum Active Problems:   Mild tetrahydrocannabinol (THC) abuse   Supervision of high-risk pregnancy   History of cesarean section   Insufficient prenatal care in third trimester   GBS (group B Streptococcus carrier), +RV culture, currently pregnant   VBAC, delivered  Additional problems: None    Discharge diagnosis: Term Pregnancy Delivered, VBAC, and CHTN                                              Post partum procedures: None Augmentation: AROM and Pitocin Complications: None  Hospital course: Induction of Labor With Vaginal Delivery   32 y.o. yo E3P2951 at [redacted]w[redacted]d was admitted to the hospital 02/04/2023 for induction of labor.  Indication for induction:  CHTN .  Patient had an labor course complicated by nothing Membrane Rupture Time/Date: 1:52 PM ,02/04/2023   Delivery Method:Vaginal, Spontaneous  Episiotomy: None  Lacerations:  None  Details of delivery can be found in separate delivery note.  Patient had a uncomplicated postpartum course. Patient is discharged home 02/06/23.  Newborn Data: Birth date:02/04/2023  Birth time:6:20 PM  Gender:Female  Living status:Living  Apgars:8 ,9  Weight:2580 g   Magnesium Sulfate received: No BMZ received: No Rhophylac:N/A MMR:N/A T-DaP:Given postpartum Flu: N/A Transfusion:No  Physical exam  Vitals:   02/05/23 0900 02/05/23 1600 02/05/23 2219 02/06/23 0520  BP: (!) 119/91 126/88 119/87 131/89  Pulse: 70 65 74 74  Resp: 16 18 18 16   Temp: 98.2 F (36.8 C) 98.4 F (36.9 C) 98.3 F (36.8 C) 98 F  (36.7 C)  TempSrc: Oral Oral Oral Oral  SpO2: 100% 100% 98% 98%  Weight:      Height:       General: alert, cooperative, and no distress Lochia: appropriate Uterine Fundus: firm Incision: N/A DVT Evaluation: No evidence of DVT seen on physical exam. Labs: Lab Results  Component Value Date   WBC 12.9 (H) 02/04/2023   HGB 10.3 (L) 02/04/2023   HCT 32.8 (L) 02/04/2023   MCV 88.4 02/04/2023   PLT 271 02/04/2023      Latest Ref Rng & Units 02/04/2023    1:45 AM  CMP  Glucose 70 - 99 mg/dL 80   BUN 6 - 20 mg/dL 6   Creatinine 8.84 - 1.66 mg/dL 0.63   Sodium 016 - 010 mmol/L 136   Potassium 3.5 - 5.1 mmol/L 3.3   Chloride 98 - 111 mmol/L 103   CO2 22 - 32 mmol/L 22   Calcium 8.9 - 10.3 mg/dL 8.7   Total Protein 6.5 - 8.1 g/dL 6.8   Total Bilirubin 0.3 - 1.2 mg/dL 0.6   Alkaline Phos 38 - 126 U/L 141   AST 15 - 41 U/L 18   ALT 0 - 44 U/L 12    Edinburgh Score:    02/05/2023    7:43 AM  Edinburgh Postnatal Depression Scale Screening Tool  I have been able to laugh and see the funny side of things. 0  I have looked forward  with enjoyment to things. 0  I have blamed myself unnecessarily when things went wrong. 2  I have been anxious or worried for no good reason. 2  I have felt scared or panicky for no good reason. 1  Things have been getting on top of me. 1  I have been so unhappy that I have had difficulty sleeping. 0  I have felt sad or miserable. 0  I have been so unhappy that I have been crying. 1  The thought of harming myself has occurred to me. 0  Edinburgh Postnatal Depression Scale Total 7     After visit meds:  Allergies as of 02/06/2023   No Known Allergies      Medication List     TAKE these medications    acetaminophen 325 MG tablet Commonly known as: Tylenol Take 2 tablets (650 mg total) by mouth every 4 (four) hours as needed (for pain scale < 4).   furosemide 20 MG tablet Commonly known as: Lasix Take 1 tablet (20 mg total) by mouth daily.    ibuprofen 600 MG tablet Commonly known as: ADVIL Take 1 tablet (600 mg total) by mouth every 6 (six) hours.   metroNIDAZOLE 500 MG tablet Commonly known as: FLAGYL Take 1 tablet (500 mg total) by mouth 2 (two) times daily for 7 days.   NIFEdipine 30 MG 24 hr tablet Commonly known as: PROCARDIA-XL/NIFEDICAL-XL Take 1 tablet (30 mg total) by mouth daily.   Vitafol Ultra 29-0.6-0.4-200 MG Caps Take 1 capsule by mouth daily.         Discharge home in stable condition Infant Feeding: Breast Infant Disposition:home with mother Discharge instruction: per After Visit Summary and Postpartum booklet. Activity: Advance as tolerated. Pelvic rest for 6 weeks.  Diet: routine diet Future Appointments: Future Appointments  Date Time Provider Department Center  02/08/2023  1:50 PM Corlis Hove, NP [facility] None  02/15/2023  1:50 PM Sue Lush, FNP [facility] None   Follow up Visit:   Please schedule this patient for a In person postpartum visit in 4 weeks with the following provider: Any provider. Additional Postpartum F/U:BP check 1 week  Low risk pregnancy complicated by: HTN Delivery mode:  Vaginal, Spontaneous  Anticipated Birth Control:   declines   02/06/2023 Celedonio Savage, MD

## 2023-02-04 NOTE — Anesthesia Procedure Notes (Addendum)
Epidural Patient location during procedure: OB Start time: 02/04/2023 3:32 PM End time: 02/04/2023 3:47 PM  Staffing Anesthesiologist: Trevor Iha, MD Performed: anesthesiologist   Preanesthetic Checklist Completed: patient identified, IV checked, site marked, risks and benefits discussed, surgical consent, monitors and equipment checked, pre-op evaluation and timeout performed  Epidural Patient position: sitting Prep: DuraPrep and site prepped and draped Patient monitoring: continuous pulse ox and blood pressure Approach: midline Location: L3-L4 Injection technique: LOR air  Needle:  Needle type: Tuohy  Needle gauge: 17 G Needle length: 9 cm and 9 Needle insertion depth: 8 cm Catheter type: closed end flexible Catheter size: 19 Gauge Catheter at skin depth: 14 cm Test dose: negative  Assessment Events: blood not aspirated, no cerebrospinal fluid, injection not painful, no injection resistance, no paresthesia and negative IV test  Additional Notes Patient identified. Risks/Benefits/Options discussed with patient including but not limited to bleeding, infection, nerve damage, paralysis, failed block, incomplete pain control, headache, blood pressure changes, nausea, vomiting, reactions to medication both or allergic, itching and postpartum back pain. Confirmed with bedside nurse the patient's most recent platelet count. Confirmed with patient that they are not currently taking any anticoagulation, have any bleeding history or any family history of bleeding disorders. Patient expressed understanding and wished to proceed. All questions were answered. Sterile technique was used throughout the entire procedure. Please see nursing notes for vital signs. Test dose was given through epidural needle and negative prior to continuing to dose epidural or start infusion. Warning signs of high block given to the patient including shortness of breath, tingling/numbness in hands, complete motor  block, or any concerning symptoms with instructions to call for help. Patient was given instructions on fall risk and not to get out of bed. All questions and concerns addressed with instructions to call with any issues.  1Attempt (S) . Patient tolerated procedure well.

## 2023-02-04 NOTE — Progress Notes (Signed)
Labor Progress Note MAYCE DYE is a 32 y.o. Z6X0960 at 105w0d presented for IOL 2/2 cHTN.  S: No acute concerns.    O:  BP 118/80   Pulse 85   Temp 98.2 F (36.8 C) (Oral)   Resp 18   Ht 4\' 11"  (1.499 m)   Wt 68.6 kg   LMP 05/14/2022   SpO2 100%   BMI 30.54 kg/m  EFM: 115bpm/moderate/+accels, variable decels  CVE: Dilation: 3 Effacement (%): 80 Cervical Position: Posterior Station: Plus 1 Presentation: Vertex Exam by:: Salvadore Dom MD   A&P: 32 y.o. A5W0981 [redacted]w[redacted]d here for IOL 2/2 cHTN #Labor: Progressing well. S/p AROM clear fluid. Continue pitocin.  #Pain: maternal support, planning for epidural #FWB: Cat II, with periods of Cat I overall reassuring #GBS positive  cHTN BP wnl. -Held procardia  Lilibeth Opie Autry-Lott, DO 4:07 PM

## 2023-02-05 NOTE — Anesthesia Postprocedure Evaluation (Signed)
Anesthesia Post Note  Patient: Sarah Collier  Procedure(s) Performed: AN AD HOC LABOR EPIDURAL     Patient location during evaluation: Mother Baby Anesthesia Type: Epidural Level of consciousness: awake and alert and oriented Pain management: satisfactory to patient Vital Signs Assessment: post-procedure vital signs reviewed and stable Respiratory status: respiratory function stable Cardiovascular status: stable Postop Assessment: no headache, no backache, epidural receding, patient able to bend at knees, no signs of nausea or vomiting, adequate PO intake and able to ambulate Anesthetic complications: no   No notable events documented.  Last Vitals:  Vitals:   02/05/23 0620 02/05/23 0900  BP: 102/71 (!) 119/91  Pulse: 80 70  Resp: 18 16  Temp: 36.7 C 36.8 C  SpO2: 100% 100%    Last Pain:  Vitals:   02/05/23 0900  TempSrc: Oral  PainSc:    Pain Goal:                   Airrion [place]

## 2023-02-05 NOTE — Progress Notes (Signed)
POSTPARTUM PROGRESS NOTE  Post Partum Day 1  Subjective:  Sarah Collier is a 32 y.o. Z6X0960 s/p VBAC at [redacted]w[redacted]d.  She reports she is doing well. No acute events overnight. She denies any problems with ambulating, voiding or po intake. Denies nausea or vomiting.  Pain is well controlled.  Lochia is appropriate.  Objective: Blood pressure 102/71, pulse 80, temperature 98.1 F (36.7 C), temperature source Oral, resp. rate 18, height 4\' 11"  (1.499 m), weight 68.6 kg, last menstrual period 05/14/2022, SpO2 100 %, unknown if currently breastfeeding.  Physical Exam:  General: alert, cooperative and no distress Chest: no respiratory distress Heart:regular rate, distal pulses intact Abdomen: soft, nontender,  Uterine Fundus: firm, appropriately tender DVT Evaluation: No calf swelling or tenderness Extremities: no edema Skin: warm, dry  Recent Labs    02/04/23 0145 02/04/23 2002  HGB 10.7* 10.3*  HCT 33.1* 32.8*    Assessment/Plan: Sarah Collier is a 32 y.o. A5W0981 s/p VBAC at [redacted]w[redacted]d   PPD#1 - Doing well  Routine postpartum care  Contraception: declines Feeding: formula  Dispo: Plan for discharge tomorrow.   LOS: 1 day   Derrel Nip, MD  OB Fellow  02/05/2023, 7:12 AM

## 2023-02-06 ENCOUNTER — Other Ambulatory Visit (HOSPITAL_COMMUNITY): Payer: Self-pay

## 2023-02-06 MED ORDER — IBUPROFEN 600 MG PO TABS
600.0000 mg | ORAL_TABLET | Freq: Four times a day (QID) | ORAL | 0 refills | Status: AC
  Filled 2023-02-06: qty 30, 8d supply, fill #0

## 2023-02-06 MED ORDER — FUROSEMIDE 20 MG PO TABS
20.0000 mg | ORAL_TABLET | Freq: Every day | ORAL | 0 refills | Status: AC
  Filled 2023-02-06: qty 5, 5d supply, fill #0

## 2023-02-06 MED ORDER — ACETAMINOPHEN 325 MG PO TABS
650.0000 mg | ORAL_TABLET | ORAL | 0 refills | Status: AC | PRN
  Filled 2023-02-06: qty 100, 9d supply, fill #0

## 2023-02-06 NOTE — Social Work (Signed)
CSW received consult for hx of marijuana use.  Referral was screened out due to the following:  ~MOB had no documented substance use after initial prenatal visit/+UPT.  ~MOB had no positive drug screens after initial prenatal visit/+UPT.  ~Baby's UDS is negative.  Please consult CSW if current concerns arise or by MOB's request.  CSW will monitor CDS results and make report to Child Protective Services if warranted.  Alyanna Stoermer, LCSWA Clinical Social Worker [phone]  

## 2023-02-08 ENCOUNTER — Encounter: Payer: Self-pay | Admitting: Student

## 2023-02-14 ENCOUNTER — Ambulatory Visit: Payer: Medicaid Other

## 2023-02-15 ENCOUNTER — Encounter: Payer: Self-pay | Admitting: Obstetrics and Gynecology

## 2023-03-01 ENCOUNTER — Telehealth (HOSPITAL_COMMUNITY): Payer: Self-pay | Admitting: *Deleted

## 2023-03-01 NOTE — Telephone Encounter (Signed)
03/01/2023  Name: Sarah Collier MRN: [id number] DOB: [birth year]  Reason for Call:  Transition of Care Hospital Discharge Call  Contact Status: Patient Contact Status: Complete  Language assistant needed: Interpreter Mode: Interpreter Not Needed        Follow-Up Questions: Do You Have Any Concerns About Your Health As You Heal From Delivery?: No Do You Have Any Concerns About Your Infants Health?: No  Edinburgh Postnatal Depression Scale:  In the Past 7 Days: I have been able to laugh and see the funny side of things.: Not quite so much now I have looked forward with enjoyment to things.: As much as I ever did I have blamed myself unnecessarily when things went wrong.: Not very often I have been anxious or worried for no good reason.: Hardly ever I have felt scared or panicky for no good reason.: No, not at all Things have been getting on top of me.: No, most of the time I have coped quite well I have been so unhappy that I have had difficulty sleeping.: Not at all I have felt sad or miserable.: Not very often I have been so unhappy that I have been crying.: Only occasionally The thought of harming myself has occurred to me.: Never Edinburgh Postnatal Depression Scale Total: 6  PHQ2-9 Depression Scale:     Discharge Follow-up: Edinburgh score requires follow up?: No Patient was advised of the following resources:: Breastfeeding Support Group, Support Group Did patient express any COVID concerns?: No  Post-discharge interventions: Reviewed Newborn Safe Sleep Practices  Salena Saner, RN 03/01/2023 15:41

## 2023-03-08 ENCOUNTER — Ambulatory Visit: Payer: Medicaid Other | Admitting: Obstetrics and Gynecology

## 2023-03-22 ENCOUNTER — Ambulatory Visit: Payer: Medicaid Other | Admitting: Obstetrics and Gynecology

## 2024-01-31 ENCOUNTER — Other Ambulatory Visit (HOSPITAL_COMMUNITY): Payer: Self-pay
